# Patient Record
Sex: Male | Born: 1990 | Race: Black or African American | Hispanic: No | Marital: Married | Smoking: Current every day smoker
Health system: Southern US, Community
[De-identification: ages and names within clinical notes are randomized; demographics above are authoritative.]

## PROBLEM LIST (undated history)

## (undated) DIAGNOSIS — T7840XA Allergy, unspecified, initial encounter: Secondary | ICD-10-CM

## (undated) DIAGNOSIS — J45909 Unspecified asthma, uncomplicated: Secondary | ICD-10-CM

## (undated) HISTORY — PX: WRIST FRACTURE SURGERY: SHX121

## (undated) HISTORY — DX: Allergy, unspecified, initial encounter: T78.40XA

---

## 2013-05-01 ENCOUNTER — Other Ambulatory Visit (HOSPITAL_COMMUNITY)
Admission: RE | Admit: 2013-05-01 | Discharge: 2013-05-01 | Disposition: A | Discharge status: Home / Self Care | Payer: PRIVATE HEALTH INSURANCE | Source: Ambulatory Visit | Attending: Family Medicine | Admitting: Family Medicine

## 2013-05-01 ENCOUNTER — Encounter (HOSPITAL_COMMUNITY): Payer: Self-pay | Admitting: Emergency Medicine

## 2013-05-01 ENCOUNTER — Emergency Department (HOSPITAL_COMMUNITY)
Admission: EM | Admit: 2013-05-01 | Discharge: 2013-05-01 | Disposition: A | Discharge status: Home / Self Care | Payer: PRIVATE HEALTH INSURANCE | Source: Home / Self Care | Attending: Family Medicine | Admitting: Family Medicine

## 2013-05-01 DIAGNOSIS — Z202 Contact with and (suspected) exposure to infections with a predominantly sexual mode of transmission: Secondary | ICD-10-CM

## 2013-05-01 DIAGNOSIS — Z113 Encounter for screening for infections with a predominantly sexual mode of transmission: Secondary | ICD-10-CM | POA: Insufficient documentation

## 2013-05-01 MED ORDER — AZITHROMYCIN 250 MG PO TABS
1000.0000 mg | ORAL_TABLET | Freq: Once | ORAL | Status: AC
  Administered 2013-05-01: 1000 mg via ORAL

## 2013-05-01 MED ORDER — AZITHROMYCIN 250 MG PO TABS
ORAL_TABLET | ORAL | Status: AC
  Filled 2013-05-01: qty 4

## 2013-05-01 NOTE — ED Notes (Signed)
Complains of burning during urination x 1 week

## 2013-05-01 NOTE — ED Provider Notes (Signed)
Medical screening examination/treatment/procedure(s) were performed by resident physician or non-physician practitioner and as supervising physician I was immediately available for consultation/collaboration.   Barkley BrunsKINDL,Rhyse Skowron DOUGLAS MD.   Linna HoffJames D Anali Cabanilla, MD 05/01/13 [phone]

## 2013-05-01 NOTE — ED Provider Notes (Signed)
CSN: [id number]     Arrival date & time 05/01/13  1805 History   First MD Initiated Contact with Patient 05/01/13 1836     Chief Complaint  Patient presents with  . Exposure to STD   (Consider location/radiation/quality/duration/timing/severity/associated sxs/prior Treatment) HPI  He is here today for exposure to STD.  He states that his girlfriend was diagnosed and treated for chlamydia this past week.  They have had unprotected sex most recently 2 weeks ago.  He does report one day where he had some penile pain and some discomfort with sex.  Denies any dysuria, rash, pain, discharge today.  No testicular pain or pelvic pain.  He would like STD screening.  He had an HIV test at his health center earlier this week.  No past medical history on file. No past surgical history on file. History reviewed. No pertinent family history. History  Substance Use Topics  . Smoking status: Current Every Day Smoker  . Smokeless tobacco: Not on file  . Alcohol Use: Yes    Review of Systems  Constitutional: Negative for fever.  Genitourinary: Negative for dysuria, discharge, penile swelling, scrotal swelling, genital sores, penile pain and testicular pain.  Skin: Negative for rash.    Allergies  Review of patient's allergies indicates no known allergies.  Home Medications  No current outpatient prescriptions on file. BP 136/81  Pulse 74  Temp(Src) 98.5 F (36.9 C) (Oral)  Resp 14  SpO2 100% Physical Exam  Constitutional: He appears well-developed and well-nourished. No distress.  Genitourinary: Testes normal and penis normal. Right testis shows no mass and no tenderness. Left testis shows no mass and no tenderness. Circumcised. No penile erythema or penile tenderness. No discharge found.  Skin: Skin is warm and dry. No rash noted.  Psychiatric: His behavior is normal. Thought content normal.    ED Course  Procedures (including critical care time) Labs Review Labs Reviewed  RPR  URINE  CYTOLOGY ANCILLARY ONLY   Imaging Review No results found.   MDM   1. Exposure to STD    Given known exposure to chlamydia will treat with azithromycin 1g today. Urine GC/Chlamydia and RPR collected. Discussed importance of avoid sex for the next week. Discussed that he could have gotten chlamydia a week ago or a year ago, we have no way of knowing. Follow up as needed.    Charm RingsErin J Breanna Mcdaniel, MD 05/01/13 [phone]

## 2013-05-01 NOTE — Discharge Instructions (Signed)
We have treated you for chlamydia today.  The results from your tests should be back in 2 days.  Some one will call you with the results.  Please avoid sex for the next week to allow the antibiotic to work.  Follow up as needed.

## 2013-05-02 LAB — RPR: RPR Ser Ql: NONREACTIVE

## 2013-05-03 LAB — URINE CYTOLOGY ANCILLARY ONLY
CHLAMYDIA, DNA PROBE: NEGATIVE
NEISSERIA GONORRHEA: NEGATIVE

## 2013-05-05 ENCOUNTER — Telehealth (HOSPITAL_COMMUNITY): Payer: Self-pay

## 2013-05-05 NOTE — ED Notes (Signed)
Pt. called in for his lab results.  Pt. verified x 2 and told that all were negative. He said his girlfriend was pos. for chlamydia. He has had unprotected sex. I told him not test is 100 % accurate. If he has symptoms of discharge or dysuria, he should get rechecked.  He said he just wants to get rechecked. Desiree LucySuzanne M [facility] 05/05/2013

## 2013-05-26 ENCOUNTER — Emergency Department (HOSPITAL_COMMUNITY)
Admission: EM | Admit: 2013-05-26 | Discharge: 2013-05-26 | Disposition: A | Discharge status: Home / Self Care | Payer: PRIVATE HEALTH INSURANCE | Source: Home / Self Care | Attending: Family Medicine | Admitting: Family Medicine

## 2013-05-26 ENCOUNTER — Encounter (HOSPITAL_COMMUNITY): Payer: Self-pay | Admitting: Emergency Medicine

## 2013-05-26 DIAGNOSIS — J020 Streptococcal pharyngitis: Secondary | ICD-10-CM

## 2013-05-26 LAB — POCT RAPID STREP A: STREPTOCOCCUS, GROUP A SCREEN (DIRECT): POSITIVE — AB

## 2013-05-26 MED ORDER — IBUPROFEN 800 MG PO TABS
800.0000 mg | ORAL_TABLET | Freq: Once | ORAL | Status: AC
  Administered 2013-05-26: 800 mg via ORAL

## 2013-05-26 MED ORDER — PENICILLIN G BENZATHINE 1200000 UNIT/2ML IM SUSP
INTRAMUSCULAR | Status: AC
  Filled 2013-05-26: qty 2

## 2013-05-26 MED ORDER — IBUPROFEN 800 MG PO TABS
ORAL_TABLET | ORAL | Status: AC
  Filled 2013-05-26: qty 1

## 2013-05-26 MED ORDER — PENICILLIN G BENZATHINE 1200000 UNIT/2ML IM SUSP
1.2000 10*6.[IU] | Freq: Once | INTRAMUSCULAR | Status: AC
  Administered 2013-05-26: 1.2 10*6.[IU] via INTRAMUSCULAR

## 2013-05-26 NOTE — ED Provider Notes (Signed)
CSN: [id number]     Arrival date & time 05/26/13  1811 History   First MD Initiated Contact with Patient 05/26/13 1931     Chief Complaint  Patient presents with  . Fever   (Consider location/radiation/quality/duration/timing/severity/associated sxs/prior Treatment) Patient is a 23 y.o. male presenting with pharyngitis. The history is provided by the patient.  Sore Throat This is a new problem. The current episode started yesterday. The problem occurs constantly. The problem has been gradually worsening. Associated symptoms comments: Fever/chills.    History reviewed. No pertinent past medical history. History reviewed. No pertinent past surgical history. History reviewed. No pertinent family history. History  Substance Use Topics  . Smoking status: Current Every Day Smoker  . Smokeless tobacco: Not on file  . Alcohol Use: Yes    Review of Systems  All other systems reviewed and are negative.   Allergies  Review of patient's allergies indicates no known allergies.  Home Medications   Prior to Admission medications   Not on File   BP 137/80  Pulse 89  Temp(Src) 103.2 F (39.6 C) (Oral)  Resp 19  SpO2 100% Physical Exam  Nursing note and vitals reviewed. Constitutional: He is oriented to person, place, and time. He appears well-developed and well-nourished.  HENT:  Head: Normocephalic and atraumatic.  Right Ear: Hearing, tympanic membrane, external ear and ear canal normal.  Left Ear: Hearing, tympanic membrane, external ear and ear canal normal.  Nose: Nose normal.  Mouth/Throat: Uvula is midline and mucous membranes are normal. No oral lesions. No trismus in the jaw. No uvula swelling. Posterior oropharyngeal erythema present. No oropharyngeal exudate, posterior oropharyngeal edema or tonsillar abscesses.  Eyes: Conjunctivae are normal. Right eye exhibits no discharge. Left eye exhibits no discharge. No scleral icterus.  Neck: Normal range of motion. Neck supple. No  thyromegaly present.  Cardiovascular: Normal rate, regular rhythm and normal heart sounds.   Pulmonary/Chest: Effort normal and breath sounds normal. No stridor.  Musculoskeletal: Normal range of motion.  Lymphadenopathy:    He has no cervical adenopathy.  Neurological: He is alert and oriented to person, place, and time.  Skin: Skin is warm and dry. No rash noted.  Psychiatric: He has a normal mood and affect. His behavior is normal.    ED Course  Procedures (including critical care time) Labs Review Labs Reviewed  POCT RAPID STREP A ([facility] ONLY) - Abnormal; Notable for the following:    Streptococcus, Group A Screen (Direct) POSITIVE (*)    All other components within normal limits    Imaging Review No results found.   MDM   1. Strep pharyngitis    Bicillin LA 1.2 MU in [facility]. Tylenol and/or ibuprofen as directed on packaging for home. Follow up prn.    Ardis RowanJennifer Lee Loudon Krakow, PA 05/26/13 2016

## 2013-05-26 NOTE — ED Notes (Signed)
No sign or symptom of allergic response noted at time of D/C, provided a note for work absence tomorrow & return on Friday

## 2013-05-26 NOTE — ED Notes (Signed)
Patient concerned about fever, body aches, cough; history of asthma

## 2013-05-26 NOTE — Discharge Instructions (Signed)
Strep Throat  Strep throat is an infection of the throat caused by a bacteria named Streptococcus pyogenes. Your caregiver may call the infection streptococcal "tonsillitis" or "pharyngitis" depending on whether there are signs of inflammation in the tonsils or back of the throat. Strep throat is most common in children aged 23 15 years during the cold months of the year, but it can occur in people of any age during any season. This infection is spread from person to person (contagious) through coughing, sneezing, or other close contact.  SYMPTOMS   · Fever or chills.  · Painful, swollen, red tonsils or throat.  · Pain or difficulty when swallowing.  · White or yellow spots on the tonsils or throat.  · Swollen, tender lymph nodes or "glands" of the neck or under the jaw.  · Red rash all over the body (rare).  DIAGNOSIS   Many different infections can cause the same symptoms. A test must be done to confirm the diagnosis so the right treatment can be given. A "rapid strep test" can help your caregiver make the diagnosis in a few minutes. If this test is not available, a light swab of the infected area can be used for a throat culture test. If a throat culture test is done, results are usually available in a day or two.  TREATMENT   Strep throat is treated with antibiotic medicine.  HOME CARE INSTRUCTIONS   · Gargle with 1 tsp of salt in 1 cup of warm water, 3 4 times per day or as needed for comfort.  · Family members who also have a sore throat or fever should be tested for strep throat and treated with antibiotics if they have the strep infection.  · Make sure everyone in your household washes their hands well.  · Do not share food, drinking cups, or personal items that could cause the infection to spread to others.  · You may need to eat a soft food diet until your sore throat gets better.  · Drink enough water and fluids to keep your urine clear or pale yellow. This will help prevent dehydration.  · Get plenty of  rest.  · Stay home from school, daycare, or work until you have been on antibiotics for 24 hours.  · Only take over-the-counter or prescription medicines for pain, discomfort, or fever as directed by your caregiver.  · If antibiotics are prescribed, take them as directed. Finish them even if you start to feel better.  SEEK MEDICAL CARE IF:   · The glands in your neck continue to enlarge.  · You develop a rash, cough, or earache.  · You cough up green, yellow-brown, or bloody sputum.  · You have pain or discomfort not controlled by medicines.  · Your problems seem to be getting worse rather than better.  SEEK IMMEDIATE MEDICAL CARE IF:   · You develop any new symptoms such as vomiting, severe headache, stiff or painful neck, chest pain, shortness of breath, or trouble swallowing.  · You develop severe throat pain, drooling, or changes in your voice.  · You develop swelling of the neck, or the skin on the neck becomes red and tender.  · You have a fever.  · You develop signs of dehydration, such as fatigue, dry mouth, and decreased urination.  · You become increasingly sleepy, or you cannot wake up completely.  Document Released: 01/12/2000 Document Revised: 01/01/2012 Document Reviewed: 03/15/2010  ExitCare® Patient Information ©2014 [employer].

## 2013-05-28 ENCOUNTER — Encounter (HOSPITAL_COMMUNITY): Payer: Self-pay | Admitting: Emergency Medicine

## 2013-05-28 ENCOUNTER — Emergency Department (HOSPITAL_COMMUNITY)
Admission: EM | Admit: 2013-05-28 | Discharge: 2013-05-29 | Disposition: A | Discharge status: Home / Self Care | Payer: PRIVATE HEALTH INSURANCE | Attending: Emergency Medicine | Admitting: Emergency Medicine

## 2013-05-28 ENCOUNTER — Emergency Department (HOSPITAL_COMMUNITY): Payer: PRIVATE HEALTH INSURANCE

## 2013-05-28 DIAGNOSIS — R112 Nausea with vomiting, unspecified: Secondary | ICD-10-CM | POA: Insufficient documentation

## 2013-05-28 DIAGNOSIS — R52 Pain, unspecified: Secondary | ICD-10-CM | POA: Insufficient documentation

## 2013-05-28 DIAGNOSIS — J029 Acute pharyngitis, unspecified: Secondary | ICD-10-CM | POA: Insufficient documentation

## 2013-05-28 DIAGNOSIS — Z79899 Other long term (current) drug therapy: Secondary | ICD-10-CM | POA: Insufficient documentation

## 2013-05-28 DIAGNOSIS — J45901 Unspecified asthma with (acute) exacerbation: Secondary | ICD-10-CM | POA: Insufficient documentation

## 2013-05-28 DIAGNOSIS — F17200 Nicotine dependence, unspecified, uncomplicated: Secondary | ICD-10-CM | POA: Insufficient documentation

## 2013-05-28 DIAGNOSIS — J40 Bronchitis, not specified as acute or chronic: Secondary | ICD-10-CM | POA: Insufficient documentation

## 2013-05-28 LAB — CBC WITH DIFFERENTIAL/PLATELET
Basophils Absolute: 0 10*3/uL (ref 0.0–0.1)
Basophils Relative: 0 % (ref 0–1)
Eosinophils Absolute: 0.3 10*3/uL (ref 0.0–0.7)
Eosinophils Relative: 3 % (ref 0–5)
HCT: 39.2 % (ref 39.0–52.0)
Hemoglobin: 13.4 g/dL (ref 13.0–17.0)
LYMPHS ABS: 1.9 10*3/uL (ref 0.7–4.0)
Lymphocytes Relative: 19 % (ref 12–46)
MCH: 30.4 pg (ref 26.0–34.0)
MCHC: 34.2 g/dL (ref 30.0–36.0)
MCV: 88.9 fL (ref 78.0–100.0)
Monocytes Absolute: 2.2 10*3/uL — ABNORMAL HIGH (ref 0.1–1.0)
Monocytes Relative: 21 % — ABNORMAL HIGH (ref 3–12)
NEUTROS PCT: 57 % (ref 43–77)
Neutro Abs: 5.9 10*3/uL (ref 1.7–7.7)
Platelets: 200 10*3/uL (ref 150–400)
RBC: 4.41 MIL/uL (ref 4.22–5.81)
RDW: 13.6 % (ref 11.5–15.5)
WBC: 10.4 10*3/uL (ref 4.0–10.5)

## 2013-05-28 LAB — I-STAT CG4 LACTIC ACID, ED: Lactic Acid, Venous: 0.84 mmol/L (ref 0.5–2.2)

## 2013-05-28 MED ORDER — SODIUM CHLORIDE 0.9 % IV BOLUS (SEPSIS)
1000.0000 mL | Freq: Once | INTRAVENOUS | Status: AC
  Administered 2013-05-28: 1000 mL via INTRAVENOUS

## 2013-05-28 MED ORDER — IPRATROPIUM-ALBUTEROL 0.5-2.5 (3) MG/3ML IN SOLN
3.0000 mL | Freq: Once | RESPIRATORY_TRACT | Status: AC
  Administered 2013-05-29: 3 mL via RESPIRATORY_TRACT
  Filled 2013-05-28: qty 3

## 2013-05-28 MED ORDER — ACETAMINOPHEN 325 MG PO TABS
650.0000 mg | ORAL_TABLET | Freq: Once | ORAL | Status: AC
  Administered 2013-05-28: 650 mg via ORAL
  Filled 2013-05-28: qty 2

## 2013-05-28 NOTE — ED Provider Notes (Signed)
Medical screening examination/treatment/procedure(s) were performed by a resident physician or non-physician practitioner and as the supervising physician I was immediately available for consultation/collaboration.  Sanaiyah Kirchhoff, MD    Ifeanyichukwu Wickham S Anah Billard, MD 05/28/13 0755 

## 2013-05-28 NOTE — ED Provider Notes (Signed)
CSN: [id number]     Arrival date & time 05/28/13  2147 History   First MD Initiated Contact with Patient 05/28/13 2305     Chief Complaint  Patient presents with  . Fever  . Generalized Body Aches     (Consider location/radiation/quality/duration/timing/severity/associated sxs/prior Treatment) HPI 23 yo man with asthma here with 3d of myalgias, chills, fever. Seen at Urgent Care 2d ago for same. Had positive rapid strep. Treated with Bicillin IM but, says he feels worse. Above symptoms continue but, patient has developed interval cough with blood tinged sputum and SOB with exertion.   Patient has been fleeting nauseated but says that he vomited x 1 immediately after he was discharged from UC 2 days ago. PO intake of fluids has been wnl. Appetite mildy diminished.    History reviewed. No pertinent past medical history. History reviewed. No pertinent past surgical history. No family history on file. History  Substance Use Topics  . Smoking status: Current Every Day Smoker  . Smokeless tobacco: Not on file  . Alcohol Use: Yes    Review of Systems Ten point review of symptoms performed and is negative with the exception of symptoms noted above.    Allergies  Review of patient's allergies indicates no known allergies.  Home Medications   Prior to Admission medications   Medication Sig Start Date End Date Taking? Authorizing Provider  acetaminophen (TYLENOL) 500 MG tablet Take 1,000 mg by mouth every 6 (six) hours as needed for mild pain or fever.   Yes Historical Provider, MD  albuterol (PROVENTIL HFA;VENTOLIN HFA) 108 (90 BASE) MCG/ACT inhaler Inhale 2 puffs into the lungs every 6 (six) hours as needed for wheezing or shortness of breath.   Yes Historical Provider, MD  ibuprofen (ADVIL,MOTRIN) 200 MG tablet Take 800 mg by mouth every 6 (six) hours as needed for fever.   Yes Historical Provider, MD   BP 116/59  Pulse 70  Temp(Src) 101.1 F (38.4 C) (Oral)  Resp 18  SpO2  98% Physical Exam Gen: well developed and well nourished appearing Head: NCAT Eyes: PERL, EOMI Nose: no epistaixis or rhinorrhea Mouth/throat: mucosa is elderly dehydrated appearing and pink Neck: supple, no stridor Lungs: CTA B, scattered wheezing bilaterally, RR 20/min, rhonchi or rales CV: regular rate and rythm, good distal pulses.  Abd: soft, notender, nondistended Back: no ttp, no cva ttp Skin: warm and dry Ext: no edema, normal to inspection Neuro: CN ii-xii grossly intact, no focal deficits Psyche; normal affect,  calm and cooperative.  ED Course  Procedures (including critical care time) Labs Review Results for orders placed during the hospital encounter of 05/28/13 (from the past 24 hour(s))  CBC WITH DIFFERENTIAL     Status: Abnormal   Collection Time    05/28/13 11:35 PM      Result Value Ref Range   WBC 10.4  4.0 - 10.5 K/uL   RBC 4.41  4.22 - 5.81 MIL/uL   Hemoglobin 13.4  13.0 - 17.0 g/dL   HCT 40.939.2  81.139.0 - 91.452.0 %   MCV 88.9  78.0 - 100.0 fL   MCH 30.4  26.0 - 34.0 pg   MCHC 34.2  30.0 - 36.0 g/dL   RDW 78.213.6  95.611.5 - 21.315.5 %   Platelets 200  150 - 400 K/uL   Neutrophils Relative % 57  43 - 77 %   Neutro Abs 5.9  1.7 - 7.7 K/uL   Lymphocytes Relative 19  12 - 46 %  Lymphs Abs 1.9  0.7 - 4.0 K/uL   Monocytes Relative 21 (*) 3 - 12 %   Monocytes Absolute 2.2 (*) 0.1 - 1.0 K/uL   Eosinophils Relative 3  0 - 5 %   Eosinophils Absolute 0.3  0.0 - 0.7 K/uL   Basophils Relative 0  0 - 1 %   Basophils Absolute 0.0  0.0 - 0.1 K/uL  I-STAT CG4 LACTIC ACID, ED     Status: None   Collection Time    05/28/13 11:38 PM      Result Value Ref Range   Lactic Acid, Venous 0.84  0.5 - 2.2 mmol/L   DG Chest 2 View (Final result)  Result time: 05/29/13 00:04:10    Final result by Rad Results In Interface (05/29/13 00:04:10)    Narrative:   CLINICAL DATA: Fever, body aches, cough, chest pain, shortness of breath  EXAM: CHEST 2 VIEW  COMPARISON:  None.  FINDINGS: Lungs are clear. No pleural effusion or pneumothorax.  The heart is normal in size.  Visualized osseous structures are within normal limits.  IMPRESSION: No evidence of acute cardiopulmonary disease.   Electronically Signed By: Charline BillsSriyesh Krishnan M.D. On: 05/29/2013 00:04           MDM   Patient is feeling and looking better after tx with IVF and albuterol neb along with prednisone for what appears to be acute bronchitis with excacerbation of asthma. Given patient's persistent fever and recent positive rapid strep test, we will cover with Augmentin for possible early CAP secondary to GAS. Patient is asked to return for red flag sx and is [facility] for [facility] f/[facility].     Brandt LoosenJulie Manly, MD 05/29/13 [phone]

## 2013-05-28 NOTE — ED Notes (Signed)
Pt. Reports fever with generalized body muscle aches and chills onset yesterday , pt. was seen here 2 days ago diagnosed with pharyngitis received IM antibiotic .

## 2013-05-29 LAB — COMPREHENSIVE METABOLIC PANEL
ALBUMIN: 3.3 g/dL — AB (ref 3.5–5.2)
ALK PHOS: 73 U/L (ref 39–117)
ALT: 19 U/L (ref 0–53)
AST: 19 U/L (ref 0–37)
BUN: 10 mg/dL (ref 6–23)
CALCIUM: 9.2 mg/dL (ref 8.4–10.5)
CO2: 24 mEq/L (ref 19–32)
Chloride: 102 mEq/L (ref 96–112)
Creatinine, Ser: 1.17 mg/dL (ref 0.50–1.35)
GFR calc non Af Amer: 87 mL/min — ABNORMAL LOW (ref >90)
GLUCOSE: 100 mg/dL — AB (ref 70–99)
POTASSIUM: 3.9 meq/L (ref 3.7–5.3)
SODIUM: 138 meq/L (ref 137–147)
Total Bilirubin: 0.3 mg/dL (ref 0.3–1.2)
Total Protein: 7.8 g/dL (ref 6.0–8.3)

## 2013-05-29 MED ORDER — AMOXICILLIN-POT CLAVULANATE 875-125 MG PO TABS: 1.0000 | ORAL_TABLET | Freq: Two times a day (BID) | ORAL | Status: DC

## 2013-05-29 MED ORDER — PREDNISONE 20 MG PO TABS: ORAL_TABLET | ORAL | Status: DC

## 2013-05-29 MED ORDER — PREDNISONE 20 MG PO TABS
60.0000 mg | ORAL_TABLET | Freq: Once | ORAL | Status: AC
  Administered 2013-05-29: 60 mg via ORAL
  Filled 2013-05-29: qty 3

## 2013-05-29 NOTE — Discharge Instructions (Signed)
Asthma, Adult  Asthma is a condition of the lungs in which the airways tighten and narrow. Asthma can make it hard to breathe. Asthma cannot be cured, but medicine and lifestyle changes can help control it. Asthma may be started (triggered) by:  · Animal skin flakes (dander).  · Dust.  · Cockroaches.  · Pollen.  · Mold.  · Smoke.  · Cleaning products.  · Hair sprays or aerosol sprays.  · Paint fumes or strong smells.  · Cold air, weather changes, and winds.  · Crying or laughing hard.  · Stress.  · Certain medicines or drugs.  · Foods, such as dried fruit, potato chips, and sparkling grape juice.  · Infections or conditions (colds, flu).  · Exercise.  · Certain medical conditions or diseases.  · Exercise or tiring activities.  HOME CARE   · Take medicine as told by your doctor.  · Use a peak flow meter as told by your doctor. A peak flow meter is a tool that measures how well the lungs are working.  · Record and keep track of the peak flow meter's readings.  · Understand and use the asthma action plan. An asthma action plan is a written plan for taking care of your asthma and treating your attacks.  · To help prevent asthma attacks:  · Do not smoke. Stay away from secondhand smoke.  · Change your heating and air conditioning filter often.  · Limit your use of fireplaces and wood stoves.  · Get rid of pests (such as roaches and mice) and their droppings.  · Throw away plants if you see mold on them.  · Clean your floors. Dust regularly. Use cleaning products that do not smell.  · Have someone vacuum when you are not home. Use a vacuum cleaner with a HEPA filter if possible.  · Replace carpet with wood, tile, or vinyl flooring. Carpet can trap animal skin flakes and dust.  · Use allergy-proof pillows, mattress covers, and box spring covers.  · Wash bed sheets and blankets every week in hot water and dry them in a dryer.  · Use blankets that are made of polyester or cotton.  · Clean bathrooms and kitchens with bleach.  If possible, have someone repaint the walls in these rooms with mold-resistant paint. Keep out of the rooms that are being cleaned and painted.  · Wash hands often.  GET HELP IF:  · You have make a whistling sound when breaking (wheeze), have shortness of breath, or have a cough even if taking medicine to prevent attacks.  · The colored mucus you cough up (sputum) is thicker than usual.  · The colored mucus you cough up changes from clear or white to yellow, green, gray, or bloody.  · You have problems from the medicine you are taking such as:  · A rash.  · Itching.  · Swelling.  · Trouble breathing.  · You need reliever medicines more than 2 3 times a week.  · Your peak flow measurement is still at 50 79% of your personal best after following the action plan for 1 hour.  GET HELP RIGHT AWAY IF:   · You seem to be worse and are not responding to medicine during an asthma attack.  · You are short of breath even at rest.  · You get short of breath when doing very little activity.  · You have trouble eating, drinking, or talking.  · You have chest pain.  ·   You have a fast heartbeat.  · Your lips or fingernails start to turn blue.  · You are lightheaded, dizzy, or faint.  · Your peak flow is less than 50% of your personal best.  · You have a fever or lasting symptoms for more than 2 3 days.  · You have a fever and your symptoms suddenly get worse.  MAKE SURE YOU:   · Understand these instructions.  · Will watch your condition.  · Will get help right away if you are not doing well or get worse.  Document Released: 07/03/2007 Document Revised: 11/04/2012 Document Reviewed: 08/13/2012  ExitCare® Patient Information ©2014 [employer].

## 2013-06-04 LAB — CULTURE, BLOOD (ROUTINE X 2)
CULTURE: NO GROWTH
Culture: NO GROWTH

## 2013-07-12 ENCOUNTER — Emergency Department (INDEPENDENT_AMBULATORY_CARE_PROVIDER_SITE_OTHER)
Admission: EM | Admit: 2013-07-12 | Discharge: 2013-07-12 | Disposition: A | Discharge status: Home / Self Care | Payer: PRIVATE HEALTH INSURANCE | Source: Home / Self Care | Attending: Emergency Medicine | Admitting: Emergency Medicine

## 2013-07-12 ENCOUNTER — Encounter (HOSPITAL_COMMUNITY): Payer: Self-pay | Admitting: Emergency Medicine

## 2013-07-12 ENCOUNTER — Other Ambulatory Visit (HOSPITAL_COMMUNITY)
Admission: RE | Admit: 2013-07-12 | Discharge: 2013-07-12 | Disposition: A | Discharge status: Home / Self Care | Payer: PRIVATE HEALTH INSURANCE | Source: Ambulatory Visit | Attending: Emergency Medicine | Admitting: Emergency Medicine

## 2013-07-12 DIAGNOSIS — R300 Dysuria: Secondary | ICD-10-CM

## 2013-07-12 DIAGNOSIS — Z202 Contact with and (suspected) exposure to infections with a predominantly sexual mode of transmission: Secondary | ICD-10-CM

## 2013-07-12 DIAGNOSIS — Z113 Encounter for screening for infections with a predominantly sexual mode of transmission: Secondary | ICD-10-CM | POA: Insufficient documentation

## 2013-07-12 LAB — RPR

## 2013-07-12 LAB — POCT URINALYSIS DIP (DEVICE)
Bilirubin Urine: NEGATIVE
Glucose, UA: NEGATIVE mg/dL
Hgb urine dipstick: NEGATIVE
Ketones, ur: NEGATIVE mg/dL
Leukocytes, UA: NEGATIVE
Nitrite: NEGATIVE
PH: 7 (ref 5.0–8.0)
PROTEIN: NEGATIVE mg/dL
SPECIFIC GRAVITY, URINE: 1.02 (ref 1.005–1.030)
UROBILINOGEN UA: 0.2 mg/dL (ref 0.0–1.0)

## 2013-07-12 MED ORDER — CEFTRIAXONE SODIUM 250 MG IJ SOLR
250.0000 mg | Freq: Once | INTRAMUSCULAR | Status: AC
  Administered 2013-07-12: 250 mg via INTRAMUSCULAR

## 2013-07-12 MED ORDER — AZITHROMYCIN 250 MG PO TABS
ORAL_TABLET | ORAL | Status: AC
  Filled 2013-07-12: qty 4

## 2013-07-12 MED ORDER — LIDOCAINE HCL (PF) 1 % IJ SOLN
INTRAMUSCULAR | Status: AC
  Filled 2013-07-12: qty 5

## 2013-07-12 MED ORDER — CEFTRIAXONE SODIUM 250 MG IJ SOLR
INTRAMUSCULAR | Status: AC
  Filled 2013-07-12: qty 250

## 2013-07-12 MED ORDER — AZITHROMYCIN 250 MG PO TABS
1000.0000 mg | ORAL_TABLET | Freq: Every day | ORAL | Status: DC
  Administered 2013-07-12: 1000 mg via ORAL

## 2013-07-12 NOTE — ED Notes (Signed)
Patient presents for STD check. Reports he has burning with urination. Denies n/v/d. Patient is alert and oriented and in no acute distress.

## 2013-07-12 NOTE — ED Notes (Signed)
[phone] best number

## 2013-07-12 NOTE — ED Provider Notes (Signed)
CSN: [id number]     Arrival date & time 07/12/13  1216 History   First MD Initiated Contact with Patient 07/12/13 1332     Chief Complaint  Patient presents with  . Exposure to STD   (Consider location/radiation/quality/duration/timing/severity/associated sxs/prior Treatment) HPI Comments: 23 year old male presents requesting an STD check. He has noted 2 episodes of dysuria in the past few days, but has also had normal urination without dysuria. He was having sexual intercourse with someone a couple weeks ago when the condom broke. He denies any testicle pain or penile discharge. Denies any systemic symptoms. He was tested for HIV a couple weeks ago, that was negative. He was not tested for syphilis.  Patient is a 23 y.o. male presenting with STD exposure.  Exposure to STD    History reviewed. No pertinent past medical history. History reviewed. No pertinent past surgical history. No family history on file. History  Substance Use Topics  . Smoking status: Current Every Day Smoker  . Smokeless tobacco: Not on file  . Alcohol Use: Yes    Review of Systems  Genitourinary: Positive for dysuria.  All other systems reviewed and are negative.   Allergies  Review of patient's allergies indicates no known allergies.  Home Medications   Prior to Admission medications   Medication Sig Start Date End Date Taking? Authorizing Provider  acetaminophen (TYLENOL) 500 MG tablet Take 1,000 mg by mouth every 6 (six) hours as needed for mild pain or fever.    Historical Provider, MD  albuterol (PROVENTIL HFA;VENTOLIN HFA) 108 (90 BASE) MCG/ACT inhaler Inhale 2 puffs into the lungs every 6 (six) hours as needed for wheezing or shortness of breath.    Historical Provider, MD  amoxicillin-clavulanate (AUGMENTIN) 875-125 MG per tablet Take 1 tablet by mouth 2 (two) times daily. 05/29/13   Brandt LoosenJulie Manly, MD  ibuprofen (ADVIL,MOTRIN) 200 MG tablet Take 800 mg by mouth every 6 (six) hours as needed for  fever.    Historical Provider, MD  predniSONE (DELTASONE) 20 MG tablet 3 tabs po day one, then 2 tabs daily x 4 days 05/29/13   Brandt LoosenJulie Manly, MD   BP 142/72  Pulse 60  Temp(Src) 98.5 F (36.9 C) (Oral)  Resp 14  SpO2 100% Physical Exam  Nursing note and vitals reviewed. Constitutional: He is oriented to person, place, and time. He appears well-developed and well-nourished. No distress.  HENT:  Head: Normocephalic.  Pulmonary/Chest: Effort normal. No respiratory distress.  Genitourinary: Testes normal and penis normal.  Lymphadenopathy:       Right: No inguinal adenopathy present.       Left: No inguinal adenopathy present.  Neurological: He is alert and oriented to person, place, and time. Coordination normal.  Skin: Skin is warm and dry. No rash noted. He is not diaphoretic.  Psychiatric: He has a normal mood and affect. Judgment normal.    ED Course  Procedures (including critical care time) Labs Review Labs Reviewed  RPR  POCT URINALYSIS DIP (DEVICE)  URINE CYTOLOGY ANCILLARY ONLY    Imaging Review No results found.   MDM   1. Dysuria   2. Possible exposure to STD    Urine cytology and RPR sent. Urinalysis is normal. July to go ahead and be treated, Rocephin and azithromycin given here.  Meds ordered this encounter  Medications  . cefTRIAXone (ROCEPHIN) injection 250 mg    Sig:   . azithromycin (ZITHROMAX) tablet 1,000 mg    Sig:      Earna CoderZachary  Myrtie NeitherH Adah Stoneberg, PA-C 07/12/13 1458

## 2013-07-12 NOTE — ED Notes (Signed)
Patient denies any reaction to antibiotics

## 2013-07-12 NOTE — Discharge Instructions (Signed)

## 2013-07-14 NOTE — ED Provider Notes (Signed)
Medical screening examination/treatment/procedure(s) were performed by non-physician practitioner and as supervising physician I was immediately available for consultation/collaboration.  Leslee Homeavid Bobbyjo Marulanda, M.D.  Reuben Likesavid C Terryon Pineiro, MD 07/14/13 1330

## 2013-09-21 ENCOUNTER — Ambulatory Visit: Payer: PRIVATE HEALTH INSURANCE

## 2013-11-10 ENCOUNTER — Other Ambulatory Visit (HOSPITAL_COMMUNITY)
Admission: RE | Admit: 2013-11-10 | Discharge: 2013-11-10 | Disposition: A | Discharge status: Home / Self Care | Payer: PRIVATE HEALTH INSURANCE | Source: Ambulatory Visit | Attending: Family Medicine | Admitting: Family Medicine

## 2013-11-10 ENCOUNTER — Emergency Department (INDEPENDENT_AMBULATORY_CARE_PROVIDER_SITE_OTHER)
Admission: EM | Admit: 2013-11-10 | Discharge: 2013-11-10 | Disposition: A | Discharge status: Home / Self Care | Payer: PRIVATE HEALTH INSURANCE | Source: Home / Self Care | Attending: Family Medicine | Admitting: Family Medicine

## 2013-11-10 ENCOUNTER — Encounter (HOSPITAL_COMMUNITY): Payer: Self-pay | Admitting: Emergency Medicine

## 2013-11-10 DIAGNOSIS — Z711 Person with feared health complaint in whom no diagnosis is made: Secondary | ICD-10-CM

## 2013-11-10 DIAGNOSIS — Z113 Encounter for screening for infections with a predominantly sexual mode of transmission: Secondary | ICD-10-CM | POA: Diagnosis not present

## 2013-11-10 DIAGNOSIS — Z202 Contact with and (suspected) exposure to infections with a predominantly sexual mode of transmission: Secondary | ICD-10-CM

## 2013-11-10 LAB — POCT URINALYSIS DIP (DEVICE)
BILIRUBIN URINE: NEGATIVE
Glucose, UA: NEGATIVE mg/dL
HGB URINE DIPSTICK: NEGATIVE
Ketones, ur: NEGATIVE mg/dL
Leukocytes, UA: NEGATIVE
Nitrite: NEGATIVE
Protein, ur: NEGATIVE mg/dL
Specific Gravity, Urine: 1.025 (ref 1.005–1.030)
Urobilinogen, UA: 0.2 mg/dL (ref 0.0–1.0)
pH: 6.5 (ref 5.0–8.0)

## 2013-11-10 MED ORDER — CEFTRIAXONE SODIUM 1 G IJ SOLR
250.0000 mg | Freq: Once | INTRAMUSCULAR | Status: AC
  Administered 2013-11-10: 250 mg via INTRAMUSCULAR

## 2013-11-10 MED ORDER — CEFTRIAXONE SODIUM 250 MG IJ SOLR
INTRAMUSCULAR | Status: AC
  Filled 2013-11-10: qty 250

## 2013-11-10 MED ORDER — LIDOCAINE HCL (PF) 1 % IJ SOLN
INTRAMUSCULAR | Status: AC
  Filled 2013-11-10: qty 5

## 2013-11-10 MED ORDER — AZITHROMYCIN 250 MG PO TABS
ORAL_TABLET | ORAL | Status: AC
  Filled 2013-11-10: qty 4

## 2013-11-10 MED ORDER — AZITHROMYCIN 250 MG PO TABS
1000.0000 mg | ORAL_TABLET | Freq: Once | ORAL | Status: AC
Start: 2013-11-10 — End: 2013-11-10
  Administered 2013-11-10: 1000 mg via ORAL

## 2013-11-10 NOTE — Discharge Instructions (Signed)
We will call with positive test results and treat as indicated  °

## 2013-11-10 NOTE — ED Provider Notes (Signed)
CSN: [id number]     Arrival date & time 11/10/13  1622 History   First MD Initiated Contact with Patient 11/10/13 1702     No chief complaint on file.  (Consider location/radiation/quality/duration/timing/severity/associated sxs/prior Treatment) Patient is a 23 y.o. male presenting with STD exposure. The history is provided by the patient.  Exposure to STD This is a new problem. The current episode started more than 1 week ago (2 weeks of dysuria, uses condoms, no sex for 3 wks.). The problem has been gradually worsening. Pertinent negatives include no chest pain and no abdominal pain.    History reviewed. No pertinent past medical history. History reviewed. No pertinent past surgical history. No family history on file. History  Substance Use Topics  . Smoking status: Current Every Day Smoker  . Smokeless tobacco: Not on file  . Alcohol Use: Yes    Review of Systems  Constitutional: Negative.   Cardiovascular: Negative for chest pain.  Gastrointestinal: Negative for abdominal pain.  Genitourinary: Positive for dysuria. Negative for urgency, frequency, hematuria, discharge, genital sores and testicular pain.    Allergies  Review of patient's allergies indicates no known allergies.  Home Medications   Prior to Admission medications   Medication Sig Start Date End Date Taking? Authorizing Provider  acetaminophen (TYLENOL) 500 MG tablet Take 1,000 mg by mouth every 6 (six) hours as needed for mild pain or fever.    Historical Provider, MD  albuterol (PROVENTIL HFA;VENTOLIN HFA) 108 (90 BASE) MCG/ACT inhaler Inhale 2 puffs into the lungs every 6 (six) hours as needed for wheezing or shortness of breath.    Historical Provider, MD  amoxicillin-clavulanate (AUGMENTIN) 875-125 MG per tablet Take 1 tablet by mouth 2 (two) times daily. 05/29/13   Brandt LoosenJulie Manly, MD  ibuprofen (ADVIL,MOTRIN) 200 MG tablet Take 800 mg by mouth every 6 (six) hours as needed for fever.    Historical Provider, MD   predniSONE (DELTASONE) 20 MG tablet 3 tabs po day one, then 2 tabs daily x 4 days 05/29/13   Brandt LoosenJulie Manly, MD   BP 129/88  Pulse 82  Temp(Src) 98.2 F (36.8 C) (Oral)  Resp 16  Ht 5\' 10"  (1.778 m)  Wt 220 lb (99.791 kg)  BMI 31.57 kg/m2  SpO2 96% Physical Exam  Nursing note and vitals reviewed. Constitutional: He is oriented to person, place, and time. He appears well-developed and well-nourished. No distress.  Abdominal: Soft. Bowel sounds are normal. There is no tenderness.  Genitourinary: Testes normal and penis normal.  Musculoskeletal: Normal range of motion.  Lymphadenopathy:       Right: No inguinal adenopathy present.       Left: No inguinal adenopathy present.  Neurological: He is alert and oriented to person, place, and time.  Skin: Skin is warm and dry.    ED Course  Procedures (including critical care time) Labs Review Labs Reviewed  POCT URINALYSIS DIP (DEVICE)  URINE CYTOLOGY ANCILLARY ONLY    Imaging Review No results found.   MDM   1. Concern about STD in male without diagnosis        Linna HoffJames D Kindl, MD 11/10/13 [phone]

## 2013-11-10 NOTE — ED Notes (Signed)
Would like to be screened for STD C/o dysuria x 2 weeks Alert, no signs of acute distress.

## 2013-11-11 LAB — URINE CYTOLOGY ANCILLARY ONLY
CHLAMYDIA, DNA PROBE: NEGATIVE
Neisseria Gonorrhea: NEGATIVE
Trichomonas: NEGATIVE

## 2014-01-30 ENCOUNTER — Encounter (HOSPITAL_COMMUNITY): Payer: Self-pay

## 2014-01-30 ENCOUNTER — Emergency Department (HOSPITAL_COMMUNITY)
Admission: EM | Admit: 2014-01-30 | Discharge: 2014-01-30 | Disposition: A | Discharge status: Home / Self Care | Payer: PRIVATE HEALTH INSURANCE | Source: Home / Self Care | Attending: Family Medicine | Admitting: Family Medicine

## 2014-01-30 DIAGNOSIS — B349 Viral infection, unspecified: Secondary | ICD-10-CM

## 2014-01-30 MED ORDER — IBUPROFEN 800 MG PO TABS
800.0000 mg | ORAL_TABLET | Freq: Once | ORAL | Status: AC
  Administered 2014-01-30: 800 mg via ORAL

## 2014-01-30 MED ORDER — IBUPROFEN 800 MG PO TABS
ORAL_TABLET | ORAL | Status: AC
  Filled 2014-01-30: qty 1

## 2014-01-30 NOTE — ED Notes (Signed)
Reports onset fever, chills, HA  yesterday

## 2014-01-30 NOTE — ED Provider Notes (Signed)
CSN: [id number]     Arrival date & time 01/30/14  0901 History   None    Chief Complaint  Patient presents with  . Chills   (Consider location/radiation/quality/duration/timing/severity/associated sxs/prior Treatment) HPI Comments: 24 year old male who considers himself to be in generally good health is complaining of chills, headache and fever since yesterday at 1800 hrs. He denies upper respiratory congestion, runny nose, PND earache or sore throat. He did not receive a flu shot this year. No GI symptoms.   History reviewed. No pertinent past medical history. History reviewed. No pertinent past surgical history. History reviewed. No pertinent family history. History  Substance Use Topics  . Smoking status: Current Every Day Smoker  . Smokeless tobacco: Not on file  . Alcohol Use: Yes    Review of Systems  Constitutional: Positive for fever, activity change and appetite change.  Respiratory: Positive for cough. Negative for shortness of breath.   Cardiovascular: Negative for chest pain and leg swelling.  Gastrointestinal: Negative.   Genitourinary: Negative.   Musculoskeletal: Negative.   Neurological: Positive for light-headedness and headaches. Negative for tremors, syncope and speech difficulty.    Allergies  Review of patient's allergies indicates no known allergies.  Home Medications   Prior to Admission medications   Medication Sig Start Date End Date Taking? Authorizing Provider  acetaminophen (TYLENOL) 500 MG tablet Take 1,000 mg by mouth every 6 (six) hours as needed for mild pain or fever.    Historical Provider, MD  albuterol (PROVENTIL HFA;VENTOLIN HFA) 108 (90 BASE) MCG/ACT inhaler Inhale 2 puffs into the lungs every 6 (six) hours as needed for wheezing or shortness of breath.    Historical Provider, MD  ibuprofen (ADVIL,MOTRIN) 200 MG tablet Take 800 mg by mouth every 6 (six) hours as needed for fever.    Historical Provider, MD   BP 118/86 mmHg  Pulse 86   Temp(Src) 99.9 F (37.7 C) (Oral)  Resp 12  SpO2 97% Physical Exam  Constitutional: He is oriented to person, place, and time. He appears well-developed and well-nourished. No distress.  Appears mildly ill, laughing, jovial, no distress.  HENT:  Mouth/Throat: Oropharynx is clear and moist. No oropharyngeal exudate.  Bilateral TMs are normal  Eyes: Conjunctivae and EOM are normal.  Neck: Normal range of motion. Neck supple.  Cardiovascular: Normal rate, regular rhythm and normal heart sounds.   Pulmonary/Chest: Effort normal and breath sounds normal. No respiratory distress. He has no wheezes. He has no rales.  Musculoskeletal: Normal range of motion. He exhibits no edema.  Lymphadenopathy:    He has no cervical adenopathy.  Neurological: He is alert and oriented to person, place, and time. He exhibits normal muscle tone.  Skin: Skin is warm and dry. No rash noted.  Nursing note and vitals reviewed.   ED Course  Procedures (including critical care time) Labs Review Labs Reviewed - No data to display  Imaging Review No results found.   MDM   1. Viral syndrome    Ibuprofen 800 mg po now and 600 mg q 6h prn at home Lots of liquids Rest      Hayden Rasmussen, NP 01/30/14 [phone]

## 2014-01-30 NOTE — Discharge Instructions (Signed)
Viral Infections Ibuprofen 600 mg every 6 hours as needed Lots of fluids Rest A viral infection can be caused by different types of viruses.Most viral infections are not serious and resolve on their own. However, some infections may cause severe symptoms and may lead to further complications. SYMPTOMS Viruses can frequently cause:  Minor sore throat.  Aches and pains.  Headaches.  Runny nose.  Different types of rashes.  Watery eyes.  Tiredness.  Cough.  Loss of appetite.  Gastrointestinal infections, resulting in nausea, vomiting, and diarrhea. These symptoms do not respond to antibiotics because the infection is not caused by bacteria. However, you might catch a bacterial infection following the viral infection. This is sometimes called a "superinfection." Symptoms of such a bacterial infection may include:  Worsening sore throat with pus and difficulty swallowing.  Swollen neck glands.  Chills and a high or persistent fever.  Severe headache.  Tenderness over the sinuses.  Persistent overall ill feeling (malaise), muscle aches, and tiredness (fatigue).  Persistent cough.  Yellow, green, or brown mucus production with coughing. HOME CARE INSTRUCTIONS   Only take over-the-counter or prescription medicines for pain, discomfort, diarrhea, or fever as directed by your caregiver.  Drink enough water and fluids to keep your urine clear or pale yellow. Sports drinks can provide valuable electrolytes, sugars, and hydration.  Get plenty of rest and maintain proper nutrition. Soups and broths with crackers or rice are fine. SEEK IMMEDIATE MEDICAL CARE IF:   You have severe headaches, shortness of breath, chest pain, neck pain, or an unusual rash.  You have uncontrolled vomiting, diarrhea, or you are unable to keep down fluids.  You or your child has an oral temperature above 102 F (38.9 C), not controlled by medicine.  Your baby is older than 3 months with a  rectal temperature of 102 F (38.9 C) or higher.  Your baby is 55 months old or younger with a rectal temperature of 100.4 F (38 C) or higher. MAKE SURE YOU:   Understand these instructions.  Will watch your condition.  Will get help right away if you are not doing well or get worse. Document Released: 10/24/2004 Document Revised: 04/08/2011 Document Reviewed: 05/21/2010 [facility] Patient Information 2015 [place], [place]. This information is not intended to replace advice given to you by your health care provider. Make sure you discuss any questions you have with your health care provider.

## 2014-01-31 ENCOUNTER — Ambulatory Visit (INDEPENDENT_AMBULATORY_CARE_PROVIDER_SITE_OTHER): Payer: PRIVATE HEALTH INSURANCE | Admitting: Family Medicine

## 2014-01-31 ENCOUNTER — Ambulatory Visit (INDEPENDENT_AMBULATORY_CARE_PROVIDER_SITE_OTHER): Payer: PRIVATE HEALTH INSURANCE

## 2014-01-31 VITALS — BP 120/70 | HR 84 | Temp 98.9°F | Resp 20 | Ht 70.0 in | Wt 242.4 lb

## 2014-01-31 DIAGNOSIS — R6883 Chills (without fever): Secondary | ICD-10-CM

## 2014-01-31 DIAGNOSIS — R05 Cough: Secondary | ICD-10-CM

## 2014-01-31 DIAGNOSIS — J189 Pneumonia, unspecified organism: Secondary | ICD-10-CM

## 2014-01-31 DIAGNOSIS — R059 Cough, unspecified: Secondary | ICD-10-CM

## 2014-01-31 DIAGNOSIS — J181 Lobar pneumonia, unspecified organism: Secondary | ICD-10-CM

## 2014-01-31 LAB — POCT INFLUENZA A/B
INFLUENZA A, POC: NEGATIVE
Influenza B, POC: NEGATIVE

## 2014-01-31 MED ORDER — BENZONATATE 100 MG PO CAPS: 100.0000 mg | ORAL_CAPSULE | Freq: Three times a day (TID) | ORAL | Status: DC | PRN

## 2014-01-31 MED ORDER — CEFTRIAXONE SODIUM 1 G IJ SOLR
1.0000 g | Freq: Once | INTRAMUSCULAR | Status: AC
  Administered 2014-01-31: 1 g via INTRAMUSCULAR

## 2014-01-31 MED ORDER — HYDROCODONE-HOMATROPINE 5-1.5 MG/5ML PO SYRP: 5.0000 mL | ORAL_SOLUTION | ORAL | Status: DC | PRN

## 2014-01-31 MED ORDER — AZITHROMYCIN 500 MG PO TABS
500.0000 mg | ORAL_TABLET | Freq: Every day | ORAL | Status: DC
Start: 2014-01-31 — End: 2014-06-15

## 2014-01-31 NOTE — Patient Instructions (Addendum)
Take the antibiotic azithromycin 1 pill daily for 5 days  Use the cough syrup 1 teaspoon every 4-6 hours if needed for bad coughing at nighttime  Take the cough pills, Tessalon (benzonatate) 1 or 2 pills 3 times daily if needed for cough  Drink plenty of fluids and get enough rest  Take Tylenol or ibuprofen if needed for further fever or chills  Stay out of work for 2 days    Pneumonia Pneumonia is an infection of the lungs.  CAUSES Pneumonia may be caused by bacteria or a virus. Usually, these infections are caused by breathing infectious particles into the lungs (respiratory tract). SIGNS AND SYMPTOMS   Cough.  Fever.  Chest pain.  Increased rate of breathing.  Wheezing.  Mucus production. DIAGNOSIS  If you have the common symptoms of pneumonia, your health care provider will typically confirm the diagnosis with a chest X-ray. The X-ray will show an abnormality in the lung (pulmonary infiltrate) if you have pneumonia. Other tests of your blood, urine, or sputum may be done to find the specific cause of your pneumonia. Your health care provider may also do tests (blood gases or pulse oximetry) to see how well your lungs are working. TREATMENT  Some forms of pneumonia may be spread to other people when you cough or sneeze. You may be asked to wear a mask before and during your exam. Pneumonia that is caused by bacteria is treated with antibiotic medicine. Pneumonia that is caused by the influenza virus may be treated with an antiviral medicine. Most other viral infections must run their course. These infections will not respond to antibiotics.  HOME CARE INSTRUCTIONS   Cough suppressants may be used if you are losing too much rest. However, coughing protects you by clearing your lungs. You should avoid using cough suppressants if you can.  Your health care provider may have prescribed medicine if he or she thinks your pneumonia is caused by bacteria or influenza. Finish your  medicine even if you start to feel better.  Your health care provider may also prescribe an expectorant. This loosens the mucus to be coughed up.  Take medicines only as directed by your health care provider.  Do not smoke. Smoking is a common cause of bronchitis and can contribute to pneumonia. If you are a smoker and continue to smoke, your cough may last several weeks after your pneumonia has cleared.  A cold steam vaporizer or humidifier in your room or home may help loosen mucus.  Coughing is often worse at night. Sleeping in a semi-upright position in a recliner or using a couple pillows under your head will help with this.  Get rest as you feel it is needed. Your body will usually let you know when you need to rest. PREVENTION A pneumococcal shot (vaccine) is available to prevent a common bacterial cause of pneumonia. This is usually suggested for:  People over 88 years old.  Patients on chemotherapy.  People with chronic lung problems, such as bronchitis or emphysema.  People with immune system problems. If you are over 65 or have a high risk condition, you may receive the pneumococcal vaccine if you have not received it before. In some countries, a routine influenza vaccine is also recommended. This vaccine can help prevent some cases of pneumonia.You may be offered the influenza vaccine as part of your care. If you smoke, it is time to quit. You may receive instructions on how to stop smoking. Your health care provider can  provide medicines and counseling to help you quit. SEEK MEDICAL CARE IF: You have a fever. SEEK IMMEDIATE MEDICAL CARE IF:   Your illness becomes worse. This is especially true if you are elderly or weakened from any other disease.  You cannot control your cough with suppressants and are losing sleep.  You begin coughing up blood.  You develop pain which is getting worse or is uncontrolled with medicines.  Any of the symptoms which initially brought  you in for treatment are getting worse rather than better.  You develop shortness of breath or chest pain. MAKE SURE YOU:   Understand these instructions.  Will watch your condition.  Will get help right away if you are not doing well or get worse. Document Released: 01/14/2005 Document Revised: 05/31/2013 Document Reviewed: 04/05/2010 [facility] Patient Information 2015 [place], [place]. This information is not intended to replace advice given to you by your health care provider. Make sure you discuss any questions you have with your health care provider.

## 2014-01-31 NOTE — Progress Notes (Signed)
Subjective: Patient has undergone a three-day history of a cough. He's had a little nasal congestion. The cough has been nonproductive. Today he has felt like he has had a hard time getting his breath. He's not been wheezing. He works as a Community education officer. Today he is very cold day. He went to the [facility] urgent care yesterday and was told he had a virus. He was concerned he might have pneumonia or something.  Objective: Alert and oriented. Muscular man. His TMs are normal. Throat clear. Neck supple without significant nodes. Chest is clear to auscultation. Heart regular without murmurs. Flu swab taken.  Assessment: Viral syndrome with nonproductive cough  Plan: Chest x-ray Flu swab  [facility] reading (PRIMARY) by  Dr. Alwyn Ren rll pneumonia  Ceftriaxone 1 g  Azithromycin 500 mg daily .  Cough syrup and Tessalon

## 2014-02-24 ENCOUNTER — Encounter (HOSPITAL_COMMUNITY): Payer: Self-pay | Admitting: Emergency Medicine

## 2014-02-24 ENCOUNTER — Emergency Department (INDEPENDENT_AMBULATORY_CARE_PROVIDER_SITE_OTHER)
Admission: EM | Admit: 2014-02-24 | Discharge: 2014-02-24 | Disposition: A | Discharge status: Home / Self Care | Payer: PRIVATE HEALTH INSURANCE | Source: Home / Self Care | Attending: Family Medicine | Admitting: Family Medicine

## 2014-02-24 ENCOUNTER — Other Ambulatory Visit (HOSPITAL_COMMUNITY)
Admission: RE | Admit: 2014-02-24 | Discharge: 2014-02-24 | Disposition: A | Discharge status: Home / Self Care | Payer: PRIVATE HEALTH INSURANCE | Source: Ambulatory Visit | Attending: Family Medicine | Admitting: Family Medicine

## 2014-02-24 DIAGNOSIS — Z711 Person with feared health complaint in whom no diagnosis is made: Secondary | ICD-10-CM

## 2014-02-24 DIAGNOSIS — Z113 Encounter for screening for infections with a predominantly sexual mode of transmission: Secondary | ICD-10-CM | POA: Insufficient documentation

## 2014-02-24 DIAGNOSIS — Z202 Contact with and (suspected) exposure to infections with a predominantly sexual mode of transmission: Secondary | ICD-10-CM

## 2014-02-24 MED ORDER — CEFTRIAXONE SODIUM 250 MG IJ SOLR
250.0000 mg | Freq: Once | INTRAMUSCULAR | Status: AC
  Administered 2014-02-24: 250 mg via INTRAMUSCULAR

## 2014-02-24 MED ORDER — AZITHROMYCIN 250 MG PO TABS
ORAL_TABLET | ORAL | Status: AC
  Filled 2014-02-24: qty 4

## 2014-02-24 MED ORDER — AZITHROMYCIN 250 MG PO TABS
1000.0000 mg | ORAL_TABLET | Freq: Once | ORAL | Status: AC
  Administered 2014-02-24: 1000 mg via ORAL

## 2014-02-24 MED ORDER — CEFTRIAXONE SODIUM 250 MG IJ SOLR
INTRAMUSCULAR | Status: AC
  Filled 2014-02-24: qty 250

## 2014-02-24 MED ORDER — LIDOCAINE HCL (PF) 1 % IJ SOLN
INTRAMUSCULAR | Status: AC
  Filled 2014-02-24: qty 5

## 2014-02-24 NOTE — Discharge Instructions (Signed)
[facility]lam[facility] Advised to practice safe sex Ad[facility] if symptoms persist No sex for 2 weeks [facility] in 2-3 mos.

## 2014-02-24 NOTE — ED Notes (Signed)
Not ready for discharge from department, patient has had injection

## 2014-02-24 NOTE — ED Notes (Signed)
Patient refused to have blood drawn for the RPR & HIV

## 2014-02-24 NOTE — ED Provider Notes (Signed)
CSN: [id number]     Arrival date & time 02/24/14  1239 History   First MD Initiated Contact with Patient 02/24/14 1259     Chief Complaint  Patient presents with  . SEXUALLY TRANSMITTED DISEASE   (Consider location/radiation/quality/duration/timing/severity/associated sxs/prior Treatment) HPI Comments: States he thinks his semen is discolored (yellow) and is concerned that he may have been exposed to STI. Had unprotected sex with new partner 3-4 weeks ago.  Denies penile discharge or dysuria. Reports himself to be otherwise healthy. PCP: none  Patient is a 24 y.o. male presenting with STD exposure. The history is provided by the patient.  Exposure to STD This is a new problem. Episode onset: +unprotected sex 3 weeks ago.    Past Medical History  Diagnosis Date  . Allergy    History reviewed. No pertinent past surgical history. Family History  Problem Relation Age of Onset  . Diabetes Mother    History  Substance Use Topics  . Smoking status: Current Every Day Smoker -- 0.50 packs/day    Types: Cigarettes  . Smokeless tobacco: Never Used  . Alcohol Use: 4.8 oz/week    8 Not specified per week    Review of Systems  All other systems reviewed and are negative.   Allergies  Review of patient's allergies indicates no known allergies.  Home Medications   Prior to Admission medications   Medication Sig Start Date End Date Taking? Authorizing Provider  albuterol (PROVENTIL HFA;VENTOLIN HFA) 108 (90 BASE) MCG/ACT inhaler Inhale 2 puffs into the lungs every 6 (six) hours as needed for wheezing or shortness of breath.    Historical Provider, MD  azithromycin (ZITHROMAX) 500 MG tablet Take 1 tablet (500 mg total) by mouth daily. 01/31/14   Peyton Najjaravid H Hopper, MD  benzonatate (TESSALON) 100 MG capsule Take 1-2 capsules (100-200 mg total) by mouth 3 (three) times daily as needed. 01/31/14   Peyton Najjaravid H Hopper, MD  HYDROcodone-homatropine [facility](HYCODAN) 5-1.5 MG/5ML syrup Take 5 mLs by mouth every 4  (four) hours as needed. 01/31/14   Peyton Najjaravid H Hopper, MD  ibuprofen (ADVIL,MOTRIN) 200 MG tablet Take 800 mg by mouth every 6 (six) hours as needed for fever.    Historical Provider, MD   BP 138/78 mmHg  Pulse 84  Temp(Src) 99.4 F (37.4 C) (Oral)  Resp 22  SpO2 97% Physical Exam  Constitutional: He is oriented to person, place, and time. He appears well-developed and well-nourished. No distress.  HENT:  Head: Normocephalic and atraumatic.  Eyes: Conjunctivae are normal. No scleral icterus.  Cardiovascular: Normal rate, regular rhythm and normal heart sounds.   Pulmonary/Chest: Effort normal and breath sounds normal.  Abdominal: Soft. Bowel sounds are normal. He exhibits no distension. There is no tenderness.  Genitourinary:  Patient declined this portion of exam  Musculoskeletal: Normal range of motion.  Neurological: He is alert and oriented to person, place, and time.  Skin: Skin is warm and dry. No rash noted. No erythema.  Psychiatric: He has a normal mood and affect. His behavior is normal.  Nursing note and vitals reviewed.   ED Course  Procedures (including critical care time) Labs Review Labs Reviewed  RPR  HIV ANTIBODY (ROUTINE TESTING)  URINE CYTOLOGY ANCILLARY ONLY    Imaging Review No results found.   MDM   1. Concern about STD in male without diagnosis     Treated empirically with azithromycin 1000mg  po and ceftriaxone 250mg  [facility] Advised to practice safe sex Advised to follow up  at [facility] if symptoms persist No sex for 2 weeks Follow up at [facility] for repeat HIV testing in 2-3 mos.    Ria Clock, [place] 02/24/14 [phone]

## 2014-02-24 NOTE — ED Notes (Signed)
Reports semen discolored: pale yellow and clear.  Minimal burning with urination

## 2014-02-25 LAB — URINE CYTOLOGY ANCILLARY ONLY
Chlamydia: NEGATIVE
NEISSERIA GONORRHEA: NEGATIVE
Trichomonas: NEGATIVE

## 2014-06-15 ENCOUNTER — Encounter: Payer: Self-pay | Admitting: *Deleted

## 2014-06-15 ENCOUNTER — Emergency Department
Admission: EM | Admit: 2014-06-15 | Discharge: 2014-06-15 | Disposition: A | Discharge status: Home / Self Care | Payer: PRIVATE HEALTH INSURANCE | Source: Home / Self Care | Attending: Family Medicine | Admitting: Family Medicine

## 2014-06-15 DIAGNOSIS — J069 Acute upper respiratory infection, unspecified: Secondary | ICD-10-CM | POA: Diagnosis not present

## 2014-06-15 DIAGNOSIS — B9789 Other viral agents as the cause of diseases classified elsewhere: Principal | ICD-10-CM

## 2014-06-15 DIAGNOSIS — J9801 Acute bronchospasm: Secondary | ICD-10-CM | POA: Diagnosis not present

## 2014-06-15 DIAGNOSIS — R05 Cough: Secondary | ICD-10-CM

## 2014-06-15 DIAGNOSIS — R059 Cough, unspecified: Secondary | ICD-10-CM

## 2014-06-15 MED ORDER — PREDNISONE 20 MG PO TABS: 20.0000 mg | ORAL_TABLET | Freq: Two times a day (BID) | ORAL | Status: DC

## 2014-06-15 MED ORDER — AZITHROMYCIN 250 MG PO TABS: ORAL_TABLET | ORAL | Status: DC

## 2014-06-15 MED ORDER — BENZONATATE 100 MG PO CAPS: ORAL_CAPSULE | ORAL | Status: DC

## 2014-06-15 NOTE — ED Notes (Signed)
Pt c/o productive cough x 4 days. Denies fever. Hx of bronchitis.

## 2014-06-15 NOTE — Discharge Instructions (Signed)
Take plain guaifenesin (1200mg  extended release tabs such as Mucinex) twice daily, with plenty of water, for cough and congestion.  May add Pseudoephedrine (30mg , one or two every 4 to 6 hours) for sinus congestion.  Get adequate rest.   May use Afrin nasal spray (or generic oxymetazoline) twice daily for about 5 days.  Also recommend using saline nasal spray several times daily and saline nasal irrigation (AYR is a common brand).   Use albuterol inhaler as needed. Try warm salt water gargles for sore throat.  Stop all antihistamines for now, and other non-prescription cough/cold preparations. May take Ibuprofen 200mg , 4 tabs every 8 hours with food for chest/sternum discomfort. Begin Azithromycin if not improving about one week or if persistent fever develops  Follow-up with family doctor if not improving about10 days.

## 2014-06-15 NOTE — ED Provider Notes (Signed)
CSN: [id number]     Arrival date & time 06/15/14  0806 History   First MD Initiated Contact with Patient 06/15/14 [phone]     Chief Complaint  Patient presents with  . Cough      HPI Comments: Patient complains of four day history of typical cold-like symptoms including mild sore throat, sinus congestion, headache, fatigue, and cough.  He has a history of exercise asthma and has developed occasional wheezing and shortness of breath with activity.   He has a history of pneumonia January 2016.  He continues to smoke.  The history is provided by the patient.    Past Medical History  Diagnosis Date  . Allergy    Past Surgical History  Procedure Laterality Date  . Wrist fracture surgery Left    Family History  Problem Relation Age of Onset  . Diabetes Mother   . Hypertension Mother    History  Substance Use Topics  . Smoking status: Current Every Day Smoker -- 1.00 packs/day    Types: Cigarettes  . Smokeless tobacco: Never Used  . Alcohol Use: Yes     Comment: 1-2    Review of Systems + sore throat + cough No pleuritic pain, but has tightness in anterior chest + wheezing + nasal congestion + post-nasal drainage No sinus pain/pressure No itchy/red eyes ? earache No hemoptysis + SOB with activity No fever, + chills No nausea No vomiting No abdominal pain No diarrhea No urinary symptoms No skin rash + fatigue + myalgias No headache Used OTC meds without relief  Allergies  Review of patient's allergies indicates no known allergies.  Home Medications   Prior to Admission medications   Medication Sig Start Date End Date Taking? Authorizing Provider  albuterol (PROVENTIL HFA;VENTOLIN HFA) 108 (90 BASE) MCG/ACT inhaler Inhale 2 puffs into the lungs every 6 (six) hours as needed for wheezing or shortness of breath.    Historical Provider, MD  azithromycin (ZITHROMAX Z-PAK) 250 MG tablet Take 2 tabs today; then begin one tab once daily for 4 more days. (Rx void after  06/23/14) 06/15/14   Lattie HawStephen A Renleigh Ouellet, MD  benzonatate (TESSALON) 100 MG capsule Take one tab by mouth HS prn cough 06/15/14   Lattie HawStephen A Maritza Goldsborough, MD  ibuprofen (ADVIL,MOTRIN) 200 MG tablet Take 800 mg by mouth every 6 (six) hours as needed for fever.    Historical Provider, MD  predniSONE (DELTASONE) 20 MG tablet Take 1 tablet (20 mg total) by mouth 2 (two) times daily. Take with food. 06/15/14   Lattie HawStephen A Devonda Pequignot, MD   BP 139/82 mmHg  Pulse 55  Temp(Src) 96.4 F (35.8 C) (Oral)  Resp 16  Ht 5\' 10"  (1.778 m)  Wt 204 lb (92.534 kg)  BMI 29.27 kg/m2  SpO2 99% Physical Exam Nursing notes and Vital Signs reviewed. Appearance:  Patient appears stated age, and in no acute distress Eyes:  Pupils are equal, round, and reactive to light and accomodation.  Extraocular movement is intact.  Conjunctivae are not inflamed  Ears:  Canals normal.  Tympanic membranes normal.  Nose:  Mildly congested turbinates.  No sinus tenderness.    Pharynx:  Normal Neck:  Supple.  Nontender enlarged posterior nodes are palpated bilaterally  Lungs:  Clear to auscultation.  Breath sounds are equal.  Heart:  Regular rate and rhythm without murmurs, rubs, or gallops.  Abdomen:  Nontender without masses or hepatosplenomegaly.  Bowel sounds are present.  No CVA or flank tenderness.  Extremities:  No edema.  No calf tenderness Skin:  No rash present.   ED Course  Procedures  none   MDM   1. Viral URI with cough   2. Bronchospasm   3. Cough    There is no evidence of bacterial infection today.  Begin prednisone burst.  Prescription written for Benzonatate (Tessalon) to take at bedtime for night-time cough.  Take plain guaifenesin (1200mg  extended release tabs such as Mucinex) twice daily, with plenty of water, for cough and congestion.  May add Pseudoephedrine (30mg , one or two every 4 to 6 hours) for sinus congestion.  Get adequate rest.   May use Afrin nasal spray (or generic oxymetazoline) twice daily for about 5 days.   Also recommend using saline nasal spray several times daily and saline nasal irrigation (AYR is a common brand).   Use albuterol inhaler as needed. Try warm salt water gargles for sore throat.  Stop all antihistamines for now, and other non-prescription cough/cold preparations. May take Ibuprofen 200mg , 4 tabs every 8 hours with food for chest/sternum discomfort. Begin Azithromycin if not improving about one week or if persistent fever develops (Given a prescription to hold, with an expiration date)  Follow-up with family doctor if not improving about10 days.      Lattie HawStephen A Nemiah Bubar, MD 06/15/14 [phone]

## 2016-08-29 ENCOUNTER — Ambulatory Visit
Admission: EM | Admit: 2016-08-29 | Discharge: 2016-08-29 | Disposition: A | Discharge status: Home / Self Care | Payer: Self-pay | Attending: Family Medicine | Admitting: Family Medicine

## 2016-08-29 DIAGNOSIS — J029 Acute pharyngitis, unspecified: Secondary | ICD-10-CM

## 2016-08-29 DIAGNOSIS — R05 Cough: Secondary | ICD-10-CM

## 2016-08-29 LAB — RAPID STREP SCREEN (MED CTR MEBANE ONLY): STREPTOCOCCUS, GROUP A SCREEN (DIRECT): NEGATIVE

## 2016-08-29 MED ORDER — AZITHROMYCIN 250 MG PO TABS: 250.0000 mg | ORAL_TABLET | Freq: Every day | ORAL | 0 refills | Status: DC

## 2016-08-29 NOTE — Discharge Instructions (Signed)
Recommend start Zithromax - take 2 tablets together at once today then 1 tablet daily until finished. Recommend take Claritin 10mg  daily. Call [facility] (listed) on Monday if symptoms are not improving.

## 2016-08-29 NOTE — ED Triage Notes (Signed)
26 year old [place] male is here today with complaints of a sore throat, non productive cough and facial pain for the past three weeks. He states he has not taken any OTC medication for relief.

## 2016-08-29 NOTE — ED Provider Notes (Signed)
CSN: [id number]     Arrival date & time 08/29/16  1341 History   First MD Initiated Contact with Patient 08/29/16 1603     Chief Complaint  Patient presents with  . Sore Throat  . Cough   (Consider location/radiation/quality/duration/timing/severity/associated sxs/prior Treatment) 26 year old male presents with irritated throat, nasal congestion, facial pain, post nasal drainage, dry cough and ear pain/pressure for the past 3 weeks. Also experiencing low grade fever and occasional loose stools. Denies any nausea or vomiting. Has not taken any medication for symptoms. Symptoms started around same time as right upper tooth infection-  Was seen at [facility] 3-4 weeks ago and took Amoxicillin. Tooth infection resolved but sore throat persisted. Does smoke and drink alcohol daily. Concerned he may have a more serious condition. No other chronic health issues. Takes no daily medication.    The history is provided by the patient.    Past Medical History:  Diagnosis Date  . Allergy    Past Surgical History:  Procedure Laterality Date  . WRIST FRACTURE SURGERY Left    Family History  Problem Relation Age of Onset  . Diabetes Mother   . Hypertension Mother    Social History  Substance Use Topics  . Smoking status: Current Every Day Smoker    Packs/day: 1.00    Types: Cigarettes  . Smokeless tobacco: Current User  . Alcohol use Yes     Comment: 1-2    Review of Systems  Constitutional: Positive for fatigue and fever. Negative for activity change, appetite change and chills.  HENT: Positive for congestion, ear pain, facial swelling, postnasal drip, rhinorrhea, sinus pain, sinus pressure and sore throat. Negative for ear discharge, mouth sores, nosebleeds, sneezing and voice change.   Eyes: Negative for pain, discharge, redness and itching.  Respiratory: Positive for cough. Negative for chest tightness, shortness of breath and wheezing.   Cardiovascular: Negative for chest pain.   Gastrointestinal: Positive for diarrhea. Negative for abdominal pain, nausea and vomiting.  Musculoskeletal: Negative for arthralgias, back pain, myalgias, neck pain and neck stiffness.  Skin: Negative for color change, rash and wound.  Neurological: Positive for headaches. Negative for dizziness, tremors, seizures, syncope, facial asymmetry, weakness, light-headedness and numbness.  Hematological: Negative for adenopathy. Does not bruise/bleed easily.  Psychiatric/Behavioral: The patient is nervous/anxious.     Allergies  Patient has no known allergies.  Home Medications   Prior to Admission medications   Medication Sig Start Date End Date Taking? Authorizing Provider  ibuprofen (ADVIL,MOTRIN) 200 MG tablet Take 800 mg by mouth every 6 (six) hours as needed for fever.   Yes [provider]  azithromycin (ZITHROMAX) 250 MG tablet Take 1 tablet (250 mg total) by mouth daily. Take first 2 tablets together, then 1 every day until finished. 08/29/16   Sudie GrumblingAmyot, Myndi Wamble Berry, NP   Meds Ordered and Administered this Visit  Medications - No data to display  BP (!) 144/76 (BP Location: Right Arm)   Pulse 88   Temp 99 F (37.2 C) (Oral)   Resp 18   Ht 5\' 9"  (1.753 m)   Wt 203 lb (92.1 kg)   SpO2 100%   BMI 29.98 kg/m  No data found.   Physical Exam  Constitutional: He is oriented to person, place, and time. He appears well-developed and well-nourished. No distress.  HENT:  Head: Normocephalic and atraumatic.  Right Ear: Hearing, tympanic membrane, external ear and ear canal normal.  Left Ear: Hearing, tympanic membrane, external ear and ear  canal normal.  Nose: Rhinorrhea present. Right sinus exhibits frontal sinus tenderness. Right sinus exhibits no maxillary sinus tenderness. Left sinus exhibits frontal sinus tenderness. Left sinus exhibits no maxillary sinus tenderness.  Mouth/Throat: Uvula is midline and mucous membranes are normal. Posterior oropharyngeal erythema present. No  tonsillar abscesses. Tonsils are 2+ on the right. Tonsils are 1+ on the left. No tonsillar exudate.  Eyes: Conjunctivae and EOM are normal. Right eye exhibits no discharge. Left eye exhibits no discharge.  Neck: Normal range of motion. Neck supple.  Cardiovascular: Normal rate, regular rhythm and normal heart sounds.   No murmur heard. Pulmonary/Chest: Effort normal and breath sounds normal. No respiratory distress. He has no decreased breath sounds. He has no wheezes. He has no rhonchi.  Musculoskeletal: Normal range of motion.  Lymphadenopathy:    He has no cervical adenopathy.  Neurological: He is alert and oriented to person, place, and time.  Skin: Skin is warm and dry. No rash noted.  Psychiatric: His speech is normal. Judgment and thought content normal. His mood appears anxious. He is agitated.    Urgent Care Course     Procedures (including critical care time)  Labs Review Labs Reviewed  RAPID STREP SCREEN (NOT AT [facility])  CULTURE, GROUP A STREP [facility])    Imaging Review No results found.   Visual Acuity Review  Right Eye Distance:   Left Eye Distance:   Bilateral Distance:    Right Eye Near:   Left Eye Near:    Bilateral Near:         MDM   1. Sore throat    Reviewed negative rapid strep test results with patient. Discussed that various etiologies could cause his symptoms including environmental allergies, viral illness, bacterial infection or chemical irritants (smoking). Clinical findings do not support dx of thrush. Recommend start OTC Claritin 10mg  daily. May take Ibuprofen 600mg  every 8 hours as needed for pain or fever. Recommend trial Zithromax as directed. Encouraged to d/c smoking.  If symptoms are not improving within 5 days, call [facility] to schedule an appointment for further evaluation.     Sudie GrumblingAmyot, Masaru Chamberlin Berry, NP 08/29/16 2217

## 2016-08-31 LAB — CULTURE, GROUP A STREP (THRC)

## 2017-02-02 ENCOUNTER — Ambulatory Visit
Admission: EM | Admit: 2017-02-02 | Discharge: 2017-02-02 | Disposition: A | Discharge status: Home / Self Care | Payer: BLUE CROSS/BLUE SHIELD | Attending: Family Medicine | Admitting: Family Medicine

## 2017-02-02 ENCOUNTER — Encounter: Payer: Self-pay | Admitting: Gynecology

## 2017-02-02 ENCOUNTER — Other Ambulatory Visit: Payer: Self-pay

## 2017-02-02 DIAGNOSIS — J069 Acute upper respiratory infection, unspecified: Secondary | ICD-10-CM | POA: Diagnosis not present

## 2017-02-02 DIAGNOSIS — R05 Cough: Secondary | ICD-10-CM | POA: Diagnosis not present

## 2017-02-02 HISTORY — DX: Unspecified asthma, uncomplicated: J45.909

## 2017-02-02 MED ORDER — FLUTICASONE PROPIONATE 50 MCG/ACT NA SUSP: 2.0000 | Freq: Every day | NASAL | 0 refills | Status: DC

## 2017-02-02 MED ORDER — HYDROCOD POLST-CPM POLST ER 10-8 MG/5ML PO SUER: 5.0000 mL | Freq: Two times a day (BID) | ORAL | 0 refills | Status: DC

## 2017-02-02 MED ORDER — PSEUDOEPHEDRINE-GUAIFENESIN ER 120-1200 MG PO TB12: 1.0000 | ORAL_TABLET | Freq: Two times a day (BID) | ORAL | 0 refills | Status: DC

## 2017-02-02 MED ORDER — BENZONATATE 200 MG PO CAPS: ORAL_CAPSULE | ORAL | 0 refills | Status: DC

## 2017-02-02 NOTE — ED Triage Notes (Signed)
Per patient c/o sinus infection.

## 2017-02-02 NOTE — ED Provider Notes (Addendum)
[facility]    CSN: [id number] Arrival date & time: 02/02/17  1131     History   Chief Complaint Chief Complaint  Patient presents with  . Sinusitis    HPI Roberto Burns is a 27 y.o. male.   HPI  27 year old male presents with 3-day onset of head pressure nasal stuffiness and a cough that is mildly productive.  No fever or chills.  He does have fatigue.  He states that his fiance was dx with a sinus infection and strep throat last week.  He smokes about half a pack of cigarettes daily.  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 Allergy   . Asthma     There are no active problems to display for this patient.   Past Surgical History:  Procedure Laterality Date  . WRIST FRACTURE SURGERY Left        Home Medications    Prior to Admission medications   Medication Sig Start Date End Date Taking? Authorizing Provider  albuterol (ACCUNEB) 0.63 MG/3ML nebulizer solution Take 1 ampule by nebulization every 6 (six) hours as needed for wheezing.   Yes [provider]  benzonatate (TESSALON) 200 MG capsule Take one cap TID PRN cough 02/02/17   Lutricia Feil, PA-C  chlorpheniramine-HYDROcodone [facility]) 10-8 MG/5ML SUER Take 5 mLs by mouth 2 (two) times daily. 02/02/17   Lutricia Feil, PA-C  fluticasone (FLONASE) 50 MCG/ACT nasal spray Place 2 sprays into both nostrils daily. 02/02/17   Lutricia Feil, PA-C  Pseudoephedrine-Guaifenesin (MUCINEX D MAX STRENGTH) [phone] MG TB12 Take 1 tablet by mouth 2 (two) times daily. 02/02/17   Lutricia Feil, PA-C    Family History Family History  Problem Relation Age of Onset  . Diabetes Mother   . Hypertension Mother     Social History Social History   Tobacco Use  . Smoking status: Current Every Day Smoker    Packs/day: 1.00    Types: Cigarettes  . Smokeless tobacco: Current User  Substance Use Topics  . Alcohol use: Yes   Comment: 1-2  . Drug use: No     Allergies   Patient has no known allergies.   Review of Systems Review of Systems  Constitutional: Positive for activity change and fatigue. Negative for chills and fever.  HENT: Positive for congestion, rhinorrhea and sinus pressure.   Respiratory: Positive for cough and shortness of breath.   All other systems reviewed and are negative.    Physical Exam Triage Vital Signs ED Triage Vitals  Enc Vitals Group     BP 02/02/17 1144 134/84     Pulse Rate 02/02/17 1144 88     Resp 02/02/17 1144 18     Temp 02/02/17 1144 99 F (37.2 C)     Temp Source 02/02/17 1144 Oral     SpO2 02/02/17 1144 98 %     Weight 02/02/17 1142 220 lb (99.8 kg)     Height 02/02/17 1142 5\' 10"  (1.778 m)     Head Circumference --      Peak Flow --      Pain Score 02/02/17 1142 2     Pain Loc --      Pain Edu? --      Excl. in GC? --    No data found.  Updated Vital Signs BP 134/84 (BP Location: Left Arm)   Pulse 88  Temp 99 F (37.2 C) (Oral)   Resp 18   Ht 5\' 10"  (1.778 m)   Wt 220 lb (99.8 kg)   SpO2 98%   BMI 31.57 kg/m   Visual Acuity Right Eye Distance:   Left Eye Distance:   Bilateral Distance:    Right Eye Near:   Left Eye Near:    Bilateral Near:     Physical Exam  Constitutional: He is oriented to person, place, and time. He appears well-developed and well-nourished. No distress.  HENT:  Head: Normocephalic.  Right Ear: External ear normal.  Left Ear: External ear normal.  Nose: Nose normal.  Mouth/Throat: Oropharynx is clear and moist. No oropharyngeal exudate.  Eyes: Pupils are equal, round, and reactive to light. Right eye exhibits no discharge. Left eye exhibits no discharge.  Neck: Normal range of motion.  Pulmonary/Chest: Effort normal and breath sounds normal.  Musculoskeletal: Normal range of motion.  Lymphadenopathy:    He has no cervical adenopathy.  Neurological: He is alert and oriented to person, place, and time.    Skin: Skin is warm and dry. He is not diaphoretic.  Psychiatric: He has a normal mood and affect. His behavior is normal. Judgment and thought content normal.  Nursing note and vitals reviewed.    UC Treatments / Results  Labs (all labs ordered are listed, but only abnormal results are displayed) Labs Reviewed - No data to display  EKG  EKG Interpretation None       Radiology No results found.  Procedures Procedures (including critical care time)  Medications Ordered in UC Medications - No data to display   Initial Impression / Assessment and Plan / UC Course  I have reviewed the triage vital signs and the nursing notes.  Pertinent labs & imaging results that were available during my care of the patient were reviewed by me and considered in my medical decision making (see chart for details).     Plan: 1. Test/x-ray results and diagnosis reviewed with patient 2. rx as per orders; risks, benefits, potential side effects reviewed with patient 3. Recommend supportive treatment with Flonase daily along with Mucinex D for congestion.  Told him this is likely a viral illness and does not require antibiotics.  We will treat his cough with Tussionex and Tessalon Perles for the daytime.  If he is not improving he should follow-up in the clinic or with a primary care physician. 4. F/u prn if symptoms worsen or don't improve   Final Clinical Impressions(s) / UC Diagnoses   Final diagnoses:  Acute upper respiratory infection    ED Discharge Orders        Ordered    fluticasone (FLONASE) 50 MCG/ACT nasal spray  Daily     02/02/17 1252    benzonatate (TESSALON) 200 MG capsule     02/02/17 1252    chlorpheniramine-HYDROcodone (TUSSIONEX PENNKINETIC ER) 10-8 MG/5ML SUER  2 times daily     02/02/17 1252    Pseudoephedrine-Guaifenesin (MUCINEX D MAX STRENGTH) [phone] MG TB12  2 times daily     02/02/17 1252       Controlled Substance Prescriptions [place] Controlled  Substance Registry consulted? Not Applicable   Lutricia FeilRoemer, Yousuf Ager P, PA-C 02/02/17 1401    Lutricia Feiloemer, Zionna Homewood P, PA-C 02/02/17 1402

## 2019-09-17 ENCOUNTER — Emergency Department
Admission: RE | Admit: 2019-09-17 | Discharge: 2019-09-17 | Disposition: A | Discharge status: Home / Self Care | Payer: Self-pay | Source: Ambulatory Visit | Attending: Family Medicine | Admitting: Family Medicine

## 2019-09-17 ENCOUNTER — Other Ambulatory Visit: Payer: Self-pay

## 2019-09-17 VITALS — BP 150/90 | HR 106 | Temp 102.7°F

## 2019-09-17 DIAGNOSIS — J029 Acute pharyngitis, unspecified: Secondary | ICD-10-CM | POA: Diagnosis not present

## 2019-09-17 DIAGNOSIS — J069 Acute upper respiratory infection, unspecified: Secondary | ICD-10-CM

## 2019-09-17 LAB — POC SARS CORONAVIRUS 2 AG -  ED: SARS Coronavirus 2 Ag: NEGATIVE

## 2019-09-17 LAB — POCT RAPID STREP A (OFFICE): Rapid Strep A Screen: NEGATIVE

## 2019-09-17 MED ORDER — PREDNISONE 20 MG PO TABS: ORAL_TABLET | ORAL | 0 refills | Status: DC

## 2019-09-17 MED ORDER — IBUPROFEN 600 MG PO TABS
600.0000 mg | ORAL_TABLET | Freq: Once | ORAL | Status: AC
  Administered 2019-09-17: 600 mg via ORAL

## 2019-09-17 MED ORDER — AZITHROMYCIN 250 MG PO TABS: ORAL_TABLET | ORAL | 0 refills | Status: DC

## 2019-09-17 NOTE — ED Provider Notes (Signed)
[facility]    CSN: [id number] Arrival date & time: 09/17/19  0845      History   Chief Complaint Chief Complaint  Patient presents with  . Appointment    9AM  . Sore Throat  . Chills    HPI Roberto Burns is a 29 y.o. male.   Patient complains of onset of sore throat two days ago.  Yesterday he had his second COVID19 vaccination, and also developed increased sinus congestion, fatigue, and fever.  He denies cough but has developed mild wheezing and tightness in his chest without shortness of breath.  He has asthma, stating that he always gets increased chest congestion during a viral URI that requires use of his albuterol inhaler.  He has used his albuterol inhaler with improvement.  He states that he took several doses of left-over penicillin when his sore throat started, without improvement. Patient reports that he had his first COVID19 vaccination three weeks ago, and developed a concurrent viral URI that lasted about two weeks before resolving.  His wife had similar symptoms at that time. Patient reports that he is travelling to [place] in two days.  The history is provided by the patient.    Past Medical History:  Diagnosis Date  . Allergy   . Asthma     There are no problems to display for this patient.   Past Surgical History:  Procedure Laterality Date  . WRIST FRACTURE SURGERY Left        Home Medications    Prior to Admission medications   Medication Sig Start Date End Date Taking? Authorizing Provider  albuterol (ACCUNEB) 0.63 MG/3ML nebulizer solution Take 1 ampule by nebulization every 6 (six) hours as needed for wheezing.    [provider]  azithromycin (ZITHROMAX Z-PAK) 250 MG tablet Take 2 tabs today; then begin one tab once daily for 4 more days. 09/17/19   Lattie Haw, MD  benzonatate (TESSALON) 200 MG capsule Take one cap TID PRN cough 02/02/17   Lutricia Feil, PA-C  chlorpheniramine-HYDROcodone [facility]) 10-8 MG/5ML SUER Take 5 mLs by mouth 2 (two) times daily. 02/02/17   Lutricia Feil, PA-C  fluticasone (FLONASE) 50 MCG/ACT nasal spray Place 2 sprays into both nostrils daily. 02/02/17   Lutricia Feil, PA-C  predniSONE (DELTASONE) 20 MG tablet Take one tab by mouth twice daily for 4 days, then one daily for 3 days. Take with food. 09/17/19   Lattie Haw, MD  Pseudoephedrine-Guaifenesin (MUCINEX D MAX STRENGTH) [phone] MG TB12 Take 1 tablet by mouth 2 (two) times daily. 02/02/17   Lutricia Feil, PA-C    Family History Family History  Problem Relation Age of Onset  . Diabetes Mother   . Hypertension Mother     Social History Social History   Tobacco Use  . Smoking status: Current Every Day Smoker    Packs/day: 1.00    Types: Cigarettes  . Smokeless tobacco: Current User  Substance Use Topics  . Alcohol use: Yes    Comment: 1-2  . Drug use: No     Allergies   Patient has no known allergies.   Review of Systems Review of Systems + sore throat No cough No pleuritic pain No wheezing No nasal congestion No post-nasal drainage No sinus pain/pressure No itchy/red eyes No earache No hemoptysis No SOB + fever/chills No nausea No vomiting No abdominal pain No diarrhea No urinary symptoms No skin rash + fatigue No myalgias  No headache   Physical Exam Triage Vital Signs ED Triage Vitals  Enc Vitals Group     BP 09/17/19 0857 (!) 150/90     Pulse Rate 09/17/19 0857 (!) 106     Resp --      Temp 09/17/19 0857 (!) 102.7 F (39.3 C)     Temp Source 09/17/19 0857 Oral     SpO2 09/17/19 0857 98 %     Weight --      Height --      Head Circumference --      Peak Flow --      Pain Score 09/17/19 0903 0     Pain Loc --      Pain Edu? --      Excl. in GC? --    No data found.  Updated Vital Signs BP (!) 150/90 (BP Location: Right Arm)   Pulse (!) 106   Temp (!) 102.7 F (39.3 C) (Oral)   SpO2 98%   Visual Acuity Right Eye  Distance:   Left Eye Distance:   Bilateral Distance:    Right Eye Near:   Left Eye Near:    Bilateral Near:     Physical Exam Nursing notes and Vital Signs reviewed. Appearance:  Patient appears stated age, and in no acute distress Eyes:  Pupils are equal, round, and reactive to light and accomodation.  Extraocular movement is intact.  Conjunctivae are not inflamed  Ears:  Canals normal.  Tympanic membranes normal.  Nose:  Mildly congested turbinates.  No sinus tenderness.  Pharynx:  Mildly erythematous, more pronounced on the right Neck:  Supple.  Mildly enlarged lateral and tonsillar nodes, nontender. Lungs:  Scattered faint wheezes.  Breath sounds are equal.  Moving air well. Heart:  Regular rate and rhythm without murmurs, rubs, or gallops.  Abdomen:  Nontender without masses or hepatosplenomegaly.  Bowel sounds are present.  No CVA or flank tenderness.  Extremities:  No edema.  Skin:  No rash present.   UC Treatments / Results  Labs (all labs ordered are listed, but only abnormal results are displayed) Labs Reviewed  SARS-COV-2 RNA,(COVID-19) QUALITATIVE NAAT  STREP A DNA PROBE  POCT RAPID STREP A (OFFICE) negative  POC SARS CORONAVIRUS 2 AG -  ED negative    EKG   Radiology No results found.  Procedures Procedures (including critical care time)  Medications Ordered in UC Medications  ibuprofen (ADVIL) tablet 600 mg (600 mg Oral Given 09/17/19 0941)    Initial Impression / Assessment and Plan / UC Course  I have reviewed the triage vital signs and the nursing notes.  Pertinent labs & imaging results that were available during my care of the patient were reviewed by me and considered in my medical decision making (see chart for details).    Patient appears to have a second viral URI with exacerbation of his asthma.  Because of his history of recurring bronchitis, will begin prednisone burst/taper and empiric Z-pak. COVID19 PCR and Strep A DNA probe pending.    Followup with Family Doctor if not improved in about two weeks.   Final Clinical Impressions(s) / UC Diagnoses   Final diagnoses:  Pharyngitis, unspecified etiology  Viral URI     Discharge Instructions     Take plain guaifenesin (1200mg  extended release tabs such as Mucinex) twice daily, with plenty of water, for cough and congestion.  May add Pseudoephedrine (30mg , one or two every 4 to 6 hours) for sinus congestion.  Get  adequate rest.   May use Afrin nasal spray (or generic oxymetazoline) each morning for about 5 days and then discontinue.  Also recommend using saline nasal spray several times daily and saline nasal irrigation (AYR is a common brand).  Use Flonase nasal spray each morning after using Afrin nasal spray and saline nasal irrigation. Try warm salt water gargles for sore throat.  Stop all antihistamines for now, and other non-prescription cough/cold preparations. May take Ibuprofen 200mg , 3 or 4 tabs every 8 hours with food for fever, body aches, etc. May take Delsym Cough Suppressant ("12 Hour Cough Relief") at bedtime for nighttime cough.  Continue albuterol inhaler as needed.      ED Prescriptions    Medication Sig Dispense Auth. Provider   predniSONE (DELTASONE) 20 MG tablet Take one tab by mouth twice daily for 4 days, then one daily for 3 days. Take with food. 11 tablet , MD   azithromycin (ZITHROMAX Z-PAK) 250 MG tablet Take 2 tabs today; then begin one tab once daily for 4 more days. 6 tablet Lattie Haw, MD        Lattie Haw, MD 09/17/19 [phone]

## 2019-09-17 NOTE — ED Triage Notes (Addendum)
Pt had covid shot 3 weeks ago and started having respiratory sxs. Was still having sxs as well as sore throat before second shot yesterday. After second shot, spiked fever afterwards and still having sore throat. Pt here to be evaluated to see if sxs are from vaccines or other unrelated illness.

## 2019-09-17 NOTE — Discharge Instructions (Addendum)
Take plain guaifenesin (1200mg  extended release tabs such as Mucinex) twice daily, with plenty of water, for cough and congestion.  May add Pseudoephedrine (30mg , one or two every 4 to 6 hours) for sinus congestion.  Get adequate rest.   May use Afrin nasal spray (or generic oxymetazoline) each morning for about 5 days and then discontinue.  Also recommend using saline nasal spray several times daily and saline nasal irrigation (AYR is a common brand).  Use Flonase nasal spray each morning after using Afrin nasal spray and saline nasal irrigation. Try warm salt water gargles for sore throat.  Stop all antihistamines for now, and other non-prescription cough/cold preparations. May take Ibuprofen 200mg , 3 or 4 tabs every 8 hours with food for fever, body aches, etc. May take Delsym Cough Suppressant ("12 Hour Cough Relief") at bedtime for nighttime cough.  Continue albuterol inhaler as needed.

## 2019-09-18 LAB — STREP A DNA PROBE: Group A Strep Probe: NOT DETECTED

## 2019-09-20 LAB — SARS-COV-2 RNA,(COVID-19) QUALITATIVE NAAT: SARS CoV2 RNA: NOT DETECTED

## 2019-09-23 ENCOUNTER — Other Ambulatory Visit: Payer: Self-pay

## 2019-09-23 ENCOUNTER — Ambulatory Visit
Admission: RE | Admit: 2019-09-23 | Discharge: 2019-09-23 | Disposition: A | Discharge status: Home / Self Care | Payer: BLUE CROSS/BLUE SHIELD | Source: Ambulatory Visit | Attending: Emergency Medicine | Admitting: Emergency Medicine

## 2019-09-23 VITALS — BP 118/82 | HR 94 | Temp 100.0°F | Resp 16

## 2019-09-23 DIAGNOSIS — H6691 Otitis media, unspecified, right ear: Secondary | ICD-10-CM

## 2019-09-23 DIAGNOSIS — J029 Acute pharyngitis, unspecified: Secondary | ICD-10-CM | POA: Diagnosis not present

## 2019-09-23 LAB — POCT RAPID STREP A (OFFICE): Rapid Strep A Screen: NEGATIVE

## 2019-09-23 MED ORDER — AMOXICILLIN-POT CLAVULANATE 875-125 MG PO TABS: 1.0000 | ORAL_TABLET | Freq: Two times a day (BID) | ORAL | 0 refills | Status: DC

## 2019-09-23 NOTE — Discharge Instructions (Signed)
Take the Augmentin as directed.  Follow up with your primary care provider if your symptoms are not improving.    

## 2019-09-23 NOTE — ED Triage Notes (Signed)
Patient reports sore throat x1 week, with swollen glands and fever. Reports he was seen in the ED, but was not satisfied with his care there.

## 2019-09-23 NOTE — ED Provider Notes (Signed)
Roberto Burns    CSN: [id number] Arrival date & time: 09/23/19  1647      History   Chief Complaint Chief Complaint  Patient presents with  . Sore Throat  . Fever  . Lymphadenopathy    HPI Roberto Burns is a 29 y.o. male.   Patient presents with sore throat, swollen lymph nodes, fever, right ear pain x1 week.  He denies rash, arthralgias, cough, shortness of breath, vomiting, diarrhea, or other symptoms.  He was seen at [facility] urgent care on 09/17/2019; diagnosed with pharyngitis; negative strep and Covid; treated with Zithromax and prednisone.  He was seen at [facility] ED today; diagnosed with viral pharyngitis; negative strep, negative mono; treated with Norco.  The history is provided by the patient.    Past Medical History:  Diagnosis Date  . Allergy   . Asthma     There are no problems to display for this patient.   Past Surgical History:  Procedure Laterality Date  . WRIST FRACTURE SURGERY Left        Home Medications    Prior to Admission medications   Medication Sig Start Date End Date Taking? Authorizing Provider  albuterol (ACCUNEB) 0.63 MG/3ML nebulizer solution Take 1 ampule by nebulization every 6 (six) hours as needed for wheezing.    [provider]  amoxicillin-clavulanate (AUGMENTIN) 875-125 MG tablet Take 1 tablet by mouth every 12 (twelve) hours. 09/23/19   Mickie Bail, NP  azithromycin (ZITHROMAX Z-PAK) 250 MG tablet Take 2 tabs today; then begin one tab once daily for 4 more days. 09/17/19   Lattie Haw, MD  benzonatate (TESSALON) 200 MG capsule Take one cap TID PRN cough 02/02/17   Lutricia Feil, PA-C  chlorpheniramine-HYDROcodone [facility]) 10-8 MG/5ML SUER Take 5 mLs by mouth 2 (two) times daily. 02/02/17   Lutricia Feil, PA-C  fluticasone (FLONASE) 50 MCG/ACT nasal spray Place 2 sprays into both nostrils daily. 02/02/17   Lutricia Feil, PA-C  predniSONE (DELTASONE) 20 MG tablet Take one tab  by mouth twice daily for 4 days, then one daily for 3 days. Take with food. 09/17/19   Lattie Haw, MD  Pseudoephedrine-Guaifenesin (MUCINEX D MAX STRENGTH) [phone] MG TB12 Take 1 tablet by mouth 2 (two) times daily. 02/02/17   Lutricia Feil, PA-C    Family History Family History  Problem Relation Age of Onset  . Diabetes Mother   . Hypertension Mother     Social History Social History   Tobacco Use  . Smoking status: Current Every Day Smoker    Packs/day: 0.50    Types: Cigarettes  . Smokeless tobacco: Current User  Substance Use Topics  . Alcohol use: Yes    Comment: 1-2  . Drug use: No     Allergies   Patient has no known allergies.   Review of Systems Review of Systems  Constitutional: Positive for fever. Negative for chills.  HENT: Positive for ear pain and sore throat. Negative for trouble swallowing.   Eyes: Negative for pain and visual disturbance.  Respiratory: Negative for cough and shortness of breath.   Cardiovascular: Negative for chest pain and palpitations.  Gastrointestinal: Negative for abdominal pain and vomiting.  Genitourinary: Negative for dysuria and hematuria.  Musculoskeletal: Negative for arthralgias and back pain.  Skin: Negative for color change and rash.  Neurological: Negative for seizures and syncope.  All other systems reviewed and are negative.    Physical Exam Triage Vital Signs ED  Triage Vitals  Enc Vitals Group     BP      Pulse      Resp      Temp      Temp src      SpO2      Weight      Height      Head Circumference      Peak Flow      Pain Score      Pain Loc      Pain Edu?      Excl. in GC?    No data found.  Updated Vital Signs BP 118/82   Pulse 94   Temp 100 F (37.8 C)   Resp 16   SpO2 97%   Visual Acuity Right Eye Distance:   Left Eye Distance:   Bilateral Distance:    Right Eye Near:   Left Eye Near:    Bilateral Near:     Physical Exam Vitals and nursing note reviewed.    Constitutional:      General: He is not in acute distress.    Appearance: He is well-developed. He is not ill-appearing.  HENT:     Head: Normocephalic and atraumatic.     Right Ear: Ear canal normal. Tympanic membrane is erythematous.     Left Ear: Tympanic membrane and ear canal normal.     Nose: Nose normal.     Mouth/Throat:     Mouth: Mucous membranes are moist.     Pharynx: Uvula midline. Oropharyngeal exudate and posterior oropharyngeal erythema present. No uvula swelling.     Tonsils: 2+ on the right. 1+ on the left.  Eyes:     Conjunctiva/sclera: Conjunctivae normal.  Cardiovascular:     Rate and Rhythm: Normal rate and regular rhythm.     Heart sounds: No murmur heard.   Pulmonary:     Effort: Pulmonary effort is normal. No respiratory distress.     Breath sounds: Normal breath sounds.  Abdominal:     Palpations: Abdomen is soft.     Tenderness: There is no abdominal tenderness. There is no guarding or rebound.  Musculoskeletal:     Cervical back: Neck supple.  Skin:    General: Skin is warm and dry.     Findings: No rash.  Neurological:     General: No focal deficit present.     Mental Status: He is alert and oriented to person, place, and time.     Gait: Gait normal.  Psychiatric:        Mood and Affect: Mood normal.        Behavior: Behavior normal.      UC Treatments / Results  Labs (all labs ordered are listed, but only abnormal results are displayed) Labs Reviewed  POCT RAPID STREP A (OFFICE)    EKG   Radiology No results found.  Procedures Procedures (including critical care time)  Medications Ordered in UC Medications - No data to display  Initial Impression / Assessment and Plan / UC Course  I have reviewed the triage vital signs and the nursing notes.  Pertinent labs & imaging results that were available during my care of the patient were reviewed by me and considered in my medical decision making (see chart for details).   Right  otitis media, acute pharyngitis.  Treating with Augmentin.  Throat culture pending.  Instructed patient to follow-up with his PCP or an ENT if his symptoms or not improving.  Patient agrees to plan  of care.   Final Clinical Impressions(s) / UC Diagnoses   Final diagnoses:  Right otitis media, unspecified otitis media type  Acute pharyngitis, unspecified etiology     Discharge Instructions     Take the Augmentin as directed.    Follow up with your primary care provider if your symptoms are not improving.        ED Prescriptions    Medication Sig Dispense Auth. Provider   amoxicillin-clavulanate (AUGMENTIN) 875-125 MG tablet Take 1 tablet by mouth every 12 (twelve) hours. 14 tablet Mickie Bail, NP     PDMP not reviewed this encounter.   Mickie Bail, NP 09/23/19 1728

## 2019-12-19 ENCOUNTER — Other Ambulatory Visit: Payer: Self-pay

## 2019-12-19 ENCOUNTER — Ambulatory Visit
Admission: EM | Admit: 2019-12-19 | Discharge: 2019-12-19 | Disposition: A | Discharge status: Home / Self Care | Payer: BLUE CROSS/BLUE SHIELD | Attending: Family Medicine | Admitting: Family Medicine

## 2019-12-19 ENCOUNTER — Encounter: Payer: Self-pay | Admitting: Emergency Medicine

## 2019-12-19 DIAGNOSIS — L0291 Cutaneous abscess, unspecified: Secondary | ICD-10-CM | POA: Diagnosis not present

## 2019-12-19 MED ORDER — DOXYCYCLINE HYCLATE 100 MG PO CAPS: 100.0000 mg | ORAL_CAPSULE | Freq: Two times a day (BID) | ORAL | 0 refills | Status: AC

## 2019-12-19 NOTE — ED Triage Notes (Signed)
Patient states that he might have an abscess or boil in his right groin that started 2 days ago.  Patient states that it is tender to the touch.

## 2019-12-19 NOTE — ED Notes (Signed)
Applied non adherent and medi pore tape over patient's incision. Instructed patient on when to change bandage and to be careful not pull out packing.

## 2019-12-19 NOTE — Discharge Instructions (Signed)
Remove packing in 48 hours.  If comes out before then that's okay.  Antibiotic as prescribed.  Take care  Dr. Adriana Simas

## 2019-12-19 NOTE — ED Provider Notes (Signed)
[facility]    CSN: [id number] Arrival date & time: 12/19/19  0846  History   Chief Complaint Chief Complaint  Patient presents with  . Abscess    right groin   HPI   29 year old male presents with the above complaints  Started 2 days ago.  He states that he has a "boil" in his right groin.  Area is tender to the touch.  No relieving factors.  No fever.  No current drainage.  He has had this previously.  No other associated symptoms.  No other complaints.  Past Medical History:  Diagnosis Date  . Allergy   . Asthma    Past Surgical History:  Procedure Laterality Date  . WRIST FRACTURE SURGERY Left    Home Medications    Prior to Admission medications   Medication Sig Start Date End Date Taking? Authorizing Provider  doxycycline (VIBRAMYCIN) 100 MG capsule Take 1 capsule (100 mg total) by mouth 2 (two) times daily. 12/19/19   [name], DO  fluticasone (FLONASE) 50 MCG/ACT nasal spray Place 2 sprays into both nostrils daily. 02/02/17 12/19/19  Lutricia Feil, PA-C    Family History Family History  Problem Relation Age of Onset  . Diabetes Mother   . Hypertension Mother     Social History Social History   Tobacco Use  . Smoking status: Current Every Day Smoker    Packs/day: 0.50    Types: Cigarettes  . Smokeless tobacco: Never Used  Vaping Use  . Vaping Use: Every day  Substance Use Topics  . Alcohol use: Yes    Comment: 1-2  . Drug use: No     Allergies   Patient has no known allergies.   Review of Systems Review of Systems  Skin:       Abscess.   Physical Exam Triage Vital Signs ED Triage Vitals  Enc Vitals Group     BP 12/19/19 0859 (!) 142/86     Pulse Rate 12/19/19 0859 92     Resp 12/19/19 0859 16     Temp 12/19/19 0859 98.9 F (37.2 C)     Temp Source 12/19/19 0859 Oral     SpO2 12/19/19 0859 100 %     Weight 12/19/19 0857 215 lb (97.5 kg)     Height 12/19/19 0857 5\' 9"  (1.753 m)     Head Circumference --       Peak Flow --      Pain Score 12/19/19 0857 7     Pain Loc --      Pain Edu? --      Excl. in GC? --    Updated Vital Signs BP (!) 142/86 (BP Location: Right Arm)   Pulse 92   Temp 98.9 F (37.2 C) (Oral)   Resp 16   Ht 5\' 9"  (1.753 m)   Wt 97.5 kg   SpO2 100%   BMI 31.75 kg/m   Visual Acuity Right Eye Distance:   Left Eye Distance:   Bilateral Distance:    Right Eye Near:   Left Eye Near:    Bilateral Near:     Physical Exam Vitals and nursing note reviewed.  Constitutional:      General: He is not in acute distress.    Appearance: Normal appearance. He is not ill-appearing.  Eyes:     General:        Right eye: No discharge.        Left eye: No discharge.  Conjunctiva/sclera: Conjunctivae normal.  Pulmonary:     Effort: Pulmonary effort is normal. No respiratory distress.  Skin:    Comments: Right inguinal region with an area of erythema and induration.  There is a small area of fluctuance.  Neurological:     Mental Status: He is alert.  Psychiatric:        Mood and Affect: Mood normal.        Behavior: Behavior normal.    UC Treatments / Results  Labs (all labs ordered are listed, but only abnormal results are displayed) Labs Reviewed - No data to display  EKG   Radiology No results found.  Procedures Incision and Drainage  Date/Time: 12/19/2019 9:57 AM Performed by: [name], DO Authorized by: [name], DO   Consent:    Consent obtained:  Verbal   Consent given by:  Patient Location:    Type:  Abscess   Location: Right groin. Pre-procedure details:    Skin preparation:  Betadine Anesthesia (see MAR for exact dosages):    Anesthesia method:  Local infiltration   Local anesthetic:  Lidocaine 1% WITH epi Procedure type:    Complexity:  Simple Procedure details:    Incision types:  Stab incision   Scalpel blade:  11   Drainage:  Bloody (Minimal pus)   Packing materials:  1/4 in gauze Post-procedure details:    Patient  tolerance of procedure:  Tolerated well, no immediate complications   (including critical care time)  Medications Ordered in UC Medications - No data to display  Initial Impression / Assessment and Plan / UC Course  I have reviewed the triage vital signs and the nursing notes.  Pertinent labs & imaging results that were available during my care of the patient were reviewed by me and considered in my medical decision making (see chart for details).    29 year old male presents with abscess.  Incision and drainage performed today.  See above.  Placed on doxycycline.  Remove packing in 48 hours.  Final Clinical Impressions(s) / UC Diagnoses   Final diagnoses:  Abscess     Discharge Instructions     Remove packing in 48 hours.  If comes out before then that's okay.  Antibiotic as prescribed.  Take care  Dr. Adriana Simas    ED Prescriptions    Medication Sig Dispense Auth. Provider   doxycycline (VIBRAMYCIN) 100 MG capsule Take 1 capsule (100 mg total) by mouth 2 (two) times daily. 14 capsule Everlene Other G, DO     PDMP not reviewed this encounter.   [name], [place] 12/19/19 [id number]

## 2019-12-21 ENCOUNTER — Emergency Department
Admission: EM | Admit: 2019-12-21 | Discharge: 2019-12-21 | Disposition: A | Discharge status: Home / Self Care | Payer: BLUE CROSS/BLUE SHIELD | Attending: Emergency Medicine | Admitting: Emergency Medicine

## 2019-12-21 ENCOUNTER — Encounter: Payer: Self-pay | Admitting: Emergency Medicine

## 2019-12-21 ENCOUNTER — Other Ambulatory Visit: Payer: Self-pay

## 2019-12-21 ENCOUNTER — Emergency Department: Payer: BLUE CROSS/BLUE SHIELD

## 2019-12-21 ENCOUNTER — Ambulatory Visit
Admission: EM | Admit: 2019-12-21 | Discharge: 2019-12-21 | Disposition: A | Discharge status: Other Acute Inpatient Hospital | Payer: BLUE CROSS/BLUE SHIELD | Attending: Family Medicine | Admitting: Family Medicine

## 2019-12-21 DIAGNOSIS — L0291 Cutaneous abscess, unspecified: Secondary | ICD-10-CM | POA: Diagnosis present

## 2019-12-21 DIAGNOSIS — L02214 Cutaneous abscess of groin: Secondary | ICD-10-CM | POA: Insufficient documentation

## 2019-12-21 DIAGNOSIS — B9689 Other specified bacterial agents as the cause of diseases classified elsewhere: Secondary | ICD-10-CM | POA: Insufficient documentation

## 2019-12-21 DIAGNOSIS — L03818 Cellulitis of other sites: Secondary | ICD-10-CM

## 2019-12-21 DIAGNOSIS — K4090 Unilateral inguinal hernia, without obstruction or gangrene, not specified as recurrent: Secondary | ICD-10-CM

## 2019-12-21 LAB — COMPREHENSIVE METABOLIC PANEL
ALT: 24 U/L (ref 0–44)
AST: 21 U/L (ref 15–41)
Albumin: 4.1 g/dL (ref 3.5–5.0)
Alkaline Phosphatase: 55 U/L (ref 38–126)
Anion gap: 10 (ref 5–15)
BUN: 11 mg/dL (ref 6–20)
CO2: 26 mmol/L (ref 22–32)
Calcium: 9.4 mg/dL (ref 8.9–10.3)
Chloride: 102 mmol/L (ref 98–111)
Creatinine, Ser: 1.13 mg/dL (ref 0.61–1.24)
GFR, Estimated: >60 mL/min (ref >60)
Glucose, Bld: 104 mg/dL — ABNORMAL HIGH (ref 70–99)
Potassium: 3.8 mmol/L (ref 3.5–5.1)
Sodium: 138 mmol/L (ref 135–145)
Total Bilirubin: 0.9 mg/dL (ref 0.3–1.2)
Total Protein: 8.4 g/dL — ABNORMAL HIGH (ref 6.5–8.1)

## 2019-12-21 LAB — CBC
HCT: 39.1 % (ref 39.0–52.0)
Hemoglobin: 13.3 g/dL (ref 13.0–17.0)
MCH: 31.5 pg (ref 26.0–34.0)
MCHC: 34 g/dL (ref 30.0–36.0)
MCV: 92.7 fL (ref 80.0–100.0)
Platelets: 213 10*3/uL (ref 150–400)
RBC: 4.22 MIL/uL (ref 4.22–5.81)
RDW: 13.6 % (ref 11.5–15.5)
WBC: 13.4 10*3/uL — ABNORMAL HIGH (ref 4.0–10.5)
nRBC: 0 % (ref 0.0–0.2)

## 2019-12-21 MED ORDER — NAPROXEN 500 MG PO TABS: 500.0000 mg | ORAL_TABLET | Freq: Two times a day (BID) | ORAL | Status: AC

## 2019-12-21 NOTE — ED Triage Notes (Signed)
Abscess to right groin, seen Sunday and was lanced, on antibiotics, seen at urgent care this am and sent to ER for potential sx

## 2019-12-21 NOTE — Discharge Instructions (Addendum)
Follow discharge care instructions continue previous medications.  Apply warm compress to the area at least twice a day for minimum of 10 minutes per session.

## 2019-12-21 NOTE — ED Provider Notes (Signed)
[facility]    CSN: [id number] Arrival date & time: 12/21/19  0807      History   Chief Complaint Chief Complaint  Patient presents with  . Abscess   HPI  29 year old male presents for follow-up regarding abscess.  Incision and drainage performed on 11/21.  Patient reports that he seems to be worsening.  He reports fever at home, T-max 99.8.  He reports that he is experiencing chills.  The area seems more swollen.  Patient is concerned given the worsening.  No other complaints this time.  Past Medical History:  Diagnosis Date  . Allergy   . Asthma    Past Surgical History:  Procedure Laterality Date  . WRIST FRACTURE SURGERY Left    Home Medications    Prior to Admission medications   Medication Sig Start Date End Date Taking? Authorizing Provider  doxycycline (VIBRAMYCIN) 100 MG capsule Take 1 capsule (100 mg total) by mouth 2 (two) times daily. 12/19/19  Yes Rendi Mapel G, DO  fluticasone (FLONASE) 50 MCG/ACT nasal spray Place 2 sprays into both nostrils daily. 02/02/17 12/19/19  Lutricia Feil, PA-C    Family History Family History  Problem Relation Age of Onset  . Diabetes Mother   . Hypertension Mother     Social History Social History   Tobacco Use  . Smoking status: Current Every Day Smoker    Packs/day: 0.50    Types: Cigarettes  . Smokeless tobacco: Never Used  Vaping Use  . Vaping Use: Every day  Substance Use Topics  . Alcohol use: Yes    Comment: 1-2  . Drug use: No     Allergies   Patient has no known allergies.   Review of Systems Review of Systems  Skin:       Right inguinal swelling, redness    Physical Exam Triage Vital Signs ED Triage Vitals  Enc Vitals Group     BP 12/21/19 0827 (!) 143/78     Pulse Rate 12/21/19 0827 83     Resp 12/21/19 0827 18     Temp 12/21/19 0827 98.8 F (37.1 C)     Temp Source 12/21/19 0827 Oral     SpO2 12/21/19 0827 100 %     Weight 12/21/19 0825 215 lb (97.5 kg)     Height  12/21/19 0825 5\' 9"  (1.753 m)     Head Circumference --      Peak Flow --      Pain Score 12/21/19 0825 4     Pain Loc --      Pain Edu? --      Excl. in GC? --    Updated Vital Signs BP (!) 143/78 (BP Location: Right Arm)   Pulse 83   Temp 98.8 F (37.1 C) (Oral)   Resp 18   Ht 5\' 9"  (1.753 m)   Wt 97.5 kg   SpO2 100%   BMI 31.75 kg/m   Visual Acuity Right Eye Distance:   Left Eye Distance:   Bilateral Distance:    Right Eye Near:   Left Eye Near:    Bilateral Near:     Physical Exam Vitals and nursing note reviewed.  Constitutional:      General: He is not in acute distress.    Appearance: Normal appearance. He is not ill-appearing.  HENT:     Head: Normocephalic and atraumatic.  Pulmonary:     Effort: Pulmonary effort is normal. No respiratory distress.  Genitourinary:  Comments: Swelling, erythema and tenderness to palpation at the labelled location. Packing removed today. Neurological:     Mental Status: He is alert.  Psychiatric:        Mood and Affect: Mood normal.        Behavior: Behavior normal.      UC Treatments / Results  Labs (all labs ordered are listed, but only abnormal results are displayed) Labs Reviewed - No data to display  EKG   Radiology No results found.  Procedures Procedures (including critical care time)  Medications Ordered in UC Medications - No data to display  Initial Impression / Assessment and Plan / UC Course  I have reviewed the triage vital signs and the nursing notes.  Pertinent labs & imaging results that were available during my care of the patient were reviewed by me and considered in my medical decision making (see chart for details).    29 year old male presents with worsening regarding recent abscess.  There is extensive induration.  There is no appreciable fluctuance at this time.  This appears to be worsening cellulitis at this time.  Discussed case with general surgery Dr. Tonna Boehringer.  He  recommended that I send him to the ER for imaging and antibiotics.  Patient is going to [facility] for further evaluation and management.  Final Clinical Impressions(s) / UC Diagnoses   Final diagnoses:  Cellulitis of other specified site     Discharge Instructions     Go to the ER (per General Surgery).  You will likely need imaging and intravenous antibiotics.  Take care  Dr. Adriana Simas     ED Prescriptions    None     PDMP not reviewed this encounter.   [name], DO 12/21/19 0900

## 2019-12-21 NOTE — ED Provider Notes (Signed)
[facility] Emergency Department Provider Note   ____________________________________________   First MD Initiated Contact with Patient 12/21/19 1058     (approximate)  I have reviewed the triage vital signs and the nursing notes.   HISTORY  Chief Complaint Abscess    HPI Roberto Burns is a 29 y.o. male patient presents with pain edema to the right groin area.  Patient was seen at urgent care clinic 2 days ago and the lesion was lanced.  Patient placed on antibiotics.  Patient had a follow-up visit to urgent care today when he removed the packing and state that is still continues to be edematous.  Patient rates his pain as a 6/10.  Patient describes pain as "achy".  Patient sent to the ED for further evaluation.         History reviewed. No pertinent past medical history.  There are no problems to display for this patient.     Prior to Admission medications   Medication Sig Start Date End Date Taking? Authorizing Provider  naproxen (NAPROSYN) 500 MG tablet Take 1 tablet (500 mg total) by mouth 2 (two) times daily with a meal. 12/21/19   [name], PA-C    Allergies Patient has no known allergies.  History reviewed. No pertinent family history.  Social History Social History   Tobacco Use  . Smoking status: Not on file  Substance Use Topics  . Alcohol use: Not on file  . Drug use: Not on file    Review of Systems Constitutional: No fever/chills Eyes: No visual changes. ENT: No sore throat. Cardiovascular: Denies chest pain. Respiratory: Denies shortness of breath. Gastrointestinal: No abdominal pain.  No nausea, no vomiting.  No diarrhea.  No constipation. Genitourinary: Negative for dysuria. Musculoskeletal: Negative for back pain. Skin: Negative for rash.  Right inguinal nodule. Neurological: Negative for headaches, focal weakness or numbness.   ____________________________________________   PHYSICAL  EXAM:  VITAL SIGNS: ED Triage Vitals  Enc Vitals Group     BP 12/21/19 0925 138/82     Pulse Rate 12/21/19 0925 90     Resp 12/21/19 0925 18     Temp 12/21/19 0925 99.4 F (37.4 C)     Temp Source 12/21/19 0925 Oral     SpO2 12/21/19 0925 100 %     Weight 12/21/19 0926 215 lb (97.5 kg)     Height 12/21/19 0926 5\' 9"  (1.753 m)     Head Circumference --      Peak Flow --      Pain Score 12/21/19 0926 6     Pain Loc --      Pain Edu? --      Excl. in [place]? --    Constitutional: Alert and oriented. Well appearing and in no acute distress. Cardiovascular: Normal rate, regular rhythm. Grossly normal heart sounds.  Good peripheral circulation. Respiratory: Normal respiratory effort.  No retractions. Lungs CTAB. Gastrointestinal: Soft and nontender. No distention. No abdominal bruits. No CVA tenderness. Genitourinary: Deferred Musculoskeletal: No lower extremity tenderness nor edema.  No joint effusions. Neurologic:  Normal speech and language. No gross focal neurologic deficits are appreciated. No gait instability. Skin:  Skin is warm, dry and intact. No rash noted.  Nodular lesion right inguinal area. Psychiatric: Mood and affect are normal. Speech and behavior are normal.  ____________________________________________   LABS (all labs ordered are listed, but only abnormal results are displayed)  Labs Reviewed  CBC - Abnormal; Notable for the following  components:      Result Value   WBC 13.4 (*)    All other components within normal limits  COMPREHENSIVE METABOLIC PANEL - Abnormal; Notable for the following components:   Glucose, Bld 104 (*)    Total Protein 8.4 (*)    All other components within normal limits   ____________________________________________  EKG   ____________________________________________  RADIOLOGY I, [name], personally viewed and evaluated these images (plain radiographs) as part of my medical decision making, as well as reviewing the written  report by the radiologist.  ED MD interpretation:    Official radiology report(s): [place] RT LOWER EXTREM LTD SOFT TISSUE NON VASCULAR  Result Date: 12/21/2019 CLINICAL DATA:  29 year old male with possible infection EXAM: ULTRASOUND RIGHT LOWER EXTREMITY LIMITED TECHNIQUE: Ultrasound examination of the lower extremity soft tissues was performed in the area of clinical concern. COMPARISON:  None FINDINGS: Grayscale and color duplex performed in the region of clinical concern. Lentiform hypoechoic focus within the superficial soft tissues in the region clinical concern. This lentiform region measures 2.3 cm x 5 mm in thickness. A margin is in communication with a hypoechoic tract towards the skin surface, length of 1.61 cm. No internal color flow. There is the suggestion of some typical appearing lymph nodes deep to this lentiform collection IMPRESSION: Nonspecific lentiform hypoechoic region in the superficial soft tissues of the right inguinal region. Given the history this is presumably inflammation/infection, with the greatest diameter measuring 2.3 cm in length, potentially retained fluid/infection, versus edema or blood products. Electronically Signed   By: Gilmer Mor D.O.   On: 12/21/2019 13:05    ____________________________________________   PROCEDURES  Procedure(s) performed (including Critical Care):  Procedures   ____________________________________________   INITIAL IMPRESSION / ASSESSMENT AND PLAN / ED COURSE  As part of my medical decision making, I reviewed the following data within the electronic MEDICAL RECORD NUMBER         Patient presents for reevaluation of right inguinal abscess which was I&D 2 days ago.  Patient is concerned that the healing process is delayed.  Discussed ultrasound findings was consistent with ovulation/infection right inguinal area.  Advised patient to continue previous antibiotics.  Patient given additional home care instructions and a prescription  for naproxen.  Return if no improvement in 3 to 5 days or worsening of complaint.     ____________________________________________   FINAL CLINICAL IMPRESSION(S) / ED DIAGNOSES  Final diagnoses:  Soft tissue abscess of inguinal region     ED Discharge Orders         Ordered    naproxen (NAPROSYN) 500 MG tablet  2 times daily with meals        12/21/19 1332          *Please note:  Roberto Burns was evaluated in Emergency Department on 12/21/2019 for the symptoms described in the history of present illness. He was evaluated in the context of the global COVID-19 pandemic, which necessitated consideration that the patient might be at risk for infection with the SARS-CoV-2 virus that causes COVID-19. Institutional protocols and algorithms that pertain to the evaluation of patients at risk for COVID-19 are in a state of rapid change based on information released by regulatory bodies including the CDC and federal and state organizations. These policies and algorithms were followed during the patient's care in the ED.  Some ED evaluations and interventions may be delayed as a result of limited staffing during and the pandemic.*   Note:  This document was prepared using Dragon voice recognition software and may include unintentional dictation errors.    [name], PA-C 12/21/19 1335    Shaune Pollack, MD 12/21/19 2109

## 2019-12-21 NOTE — Discharge Instructions (Signed)
Go to the ER (per General Surgery).  You will likely need imaging and intravenous antibiotics.  Take care  Dr. Adriana Simas

## 2019-12-21 NOTE — ED Triage Notes (Signed)
Patient is being discharged from the Urgent Care and sent to the Emergency Department via POV . Per Dr. Adriana Simas, patient is in need of higher level of care due to abscess. Patient is aware and verbalizes understanding of plan of care.  Vitals:   12/21/19 0827  BP: (!) 143/78  Pulse: 83  Resp: 18  Temp: 98.8 F (37.1 C)  SpO2: 100%

## 2019-12-21 NOTE — ED Triage Notes (Signed)
Patient states he was here on Sunday and had an abscess drained in the right groin area. He states the area is swelling more and he has had a fever. He has not removed the packing yet.

## 2021-01-01 ENCOUNTER — Encounter: Payer: Self-pay | Admitting: Emergency Medicine

## 2021-01-01 ENCOUNTER — Other Ambulatory Visit: Payer: Self-pay

## 2021-01-01 ENCOUNTER — Ambulatory Visit
Admission: EM | Admit: 2021-01-01 | Discharge: 2021-01-01 | Disposition: A | Discharge status: Home / Self Care | Payer: BLUE CROSS/BLUE SHIELD | Attending: Physician Assistant | Admitting: Physician Assistant

## 2021-01-01 DIAGNOSIS — R509 Fever, unspecified: Secondary | ICD-10-CM

## 2021-01-01 DIAGNOSIS — J45909 Unspecified asthma, uncomplicated: Secondary | ICD-10-CM | POA: Diagnosis not present

## 2021-01-01 DIAGNOSIS — J101 Influenza due to other identified influenza virus with other respiratory manifestations: Secondary | ICD-10-CM | POA: Insufficient documentation

## 2021-01-01 DIAGNOSIS — R051 Acute cough: Secondary | ICD-10-CM | POA: Diagnosis present

## 2021-01-01 DIAGNOSIS — Z20822 Contact with and (suspected) exposure to covid-19: Secondary | ICD-10-CM | POA: Insufficient documentation

## 2021-01-01 LAB — RESP PANEL BY RT-PCR (FLU A&B, COVID) ARPGX2
Influenza A by PCR: POSITIVE — AB
Influenza B by PCR: NEGATIVE
SARS Coronavirus 2 by RT PCR: NEGATIVE

## 2021-01-01 MED ORDER — IBUPROFEN 600 MG PO TABS
600.0000 mg | ORAL_TABLET | Freq: Once | ORAL | Status: AC
  Administered 2021-01-01: 600 mg via ORAL

## 2021-01-01 MED ORDER — OSELTAMIVIR PHOSPHATE 75 MG PO CAPS: 75.0000 mg | ORAL_CAPSULE | Freq: Two times a day (BID) | ORAL | 0 refills | Status: AC

## 2021-01-01 MED ORDER — PSEUDOEPH-BROMPHEN-DM 30-2-10 MG/5ML PO SYRP: 10.0000 mL | ORAL_SOLUTION | Freq: Four times a day (QID) | ORAL | 0 refills | Status: AC | PRN

## 2021-01-01 NOTE — ED Provider Notes (Signed)
[facility]    CSN: [id number] Arrival date & time: 01/01/21  1549      History   Chief Complaint Chief Complaint  Patient presents with   Fever   Cough    HPI Roberto Burns is a 30 y.o. male presenting for onset of fever of 103 degrees, fatigue, body aches, cough and congestion yesterday.  Patient says he feels short of breath like he has bronchitis.  Patient has taken Advil for symptoms.  Last dose of Advil was several hours ago and temp is currently 102.9 degrees.  Denies any sick contacts or known exposure to influenza or COVID-19.  He has not had any sore throat, headaches, vomiting or diarrhea.  History of asthma.  No other complaints.  HPI  Past Medical History:  Diagnosis Date   Allergy    Asthma     There are no problems to display for this patient.   Past Surgical History:  Procedure Laterality Date   WRIST FRACTURE SURGERY Left        Home Medications    Prior to Admission medications   Medication Sig Start Date End Date Taking? Authorizing Provider  brompheniramine-pseudoephedrine-DM 30-2-10 MG/5ML syrup Take 10 mLs by mouth 4 (four) times daily as needed for up to 7 days. 01/01/21 01/08/21 Yes Shirlee Latch, PA-C  oseltamivir (TAMIFLU) 75 MG capsule Take 1 capsule (75 mg total) by mouth every 12 (twelve) hours for 5 days. 01/01/21 01/06/21 Yes [name], PA-C  doxycycline (VIBRAMYCIN) 100 MG capsule Take 1 capsule (100 mg total) by mouth 2 (two) times daily. 12/19/19   [name], DO  naproxen (NAPROSYN) 500 MG tablet Take 1 tablet (500 mg total) by mouth 2 (two) times daily with a meal. 12/21/19   [name], PA-C  fluticasone [facility]) 50 MCG/ACT nasal spray Place 2 sprays into both nostrils daily. 02/02/17 12/19/19  Lutricia Feil, PA-C    Family History Family History  Problem Relation Age of Onset   Diabetes Mother    Hypertension Mother     Social History Social History   Tobacco Use   Smoking  status: Every Day    Packs/day: 0.50    Types: Cigarettes   Smokeless tobacco: Never  Vaping Use   Vaping Use: Every day  Substance Use Topics   Alcohol use: Yes    Comment: 1-2   Drug use: No     Allergies   Patient has no known allergies.   Review of Systems Review of Systems  Constitutional:  Positive for fatigue and fever.  HENT:  Positive for congestion. Negative for rhinorrhea, sinus pressure, sinus pain and sore throat.   Respiratory:  Positive for cough. Negative for shortness of breath.   Gastrointestinal:  Negative for abdominal pain, diarrhea, nausea and vomiting.  Musculoskeletal:  Positive for myalgias.  Neurological:  Negative for weakness, light-headedness and headaches.  Hematological:  Negative for adenopathy.    Physical Exam Triage Vital Signs ED Triage Vitals  Enc Vitals Group     BP 01/01/21 1732 (!) 166/98     Pulse Rate 01/01/21 1732 100     Resp 01/01/21 1732 18     Temp 01/01/21 1732 (!) 102.9 F (39.4 C)     Temp Source 01/01/21 1732 Oral     SpO2 01/01/21 1732 100 %     Weight 01/01/21 1731 214 lb 15.2 oz (97.5 kg)     Height 01/01/21 1731 5\' 9"  (1.753 m)  Head Circumference --      Peak Flow --      Pain Score 01/01/21 1730 5     Pain Loc --      Pain Edu? --      Excl. in GC? --    No data found.  Updated Vital Signs BP (!) 166/98 (BP Location: Left Arm)   Pulse 100   Temp (!) 102.9 F (39.4 C) (Oral)   Resp 18   Ht 5\' 9"  (1.753 m)   Wt 214 lb 15.2 oz (97.5 kg)   SpO2 100%   BMI 31.74 kg/m      Physical Exam Vitals and nursing note reviewed.  Constitutional:      General: He is not in acute distress.    Appearance: Normal appearance. He is well-developed. He is ill-appearing.  HENT:     Head: Normocephalic and atraumatic.     Nose: Congestion present.     Mouth/Throat:     Mouth: Mucous membranes are moist.     Pharynx: Oropharynx is clear.  Eyes:     General: No scleral icterus.    Conjunctiva/sclera:  Conjunctivae normal.  Cardiovascular:     Rate and Rhythm: Regular rhythm. Tachycardia present.     Heart sounds: Normal heart sounds.  Pulmonary:     Effort: Pulmonary effort is normal. No respiratory distress.     Breath sounds: Normal breath sounds.  Musculoskeletal:        General: No swelling.     Cervical back: Neck supple.  Skin:    General: Skin is warm and dry.     Capillary Refill: Capillary refill takes less than 2 seconds.  Neurological:     General: No focal deficit present.     Mental Status: He is alert. Mental status is at baseline.     Motor: No weakness.     Coordination: Coordination normal.     Gait: Gait normal.  Psychiatric:        Mood and Affect: Mood normal.        Behavior: Behavior normal.        Thought Content: Thought content normal.     UC Treatments / Results  Labs (all labs ordered are listed, but only abnormal results are displayed) Labs Reviewed  RESP PANEL BY RT-PCR (FLU A&B, COVID) ARPGX2 - Abnormal; Notable for the following components:      Result Value   Influenza A by PCR POSITIVE (*)    All other components within normal limits    EKG   Radiology No results found.  Procedures Procedures (including critical care time)  Medications Ordered in UC Medications  ibuprofen (ADVIL) tablet 600 mg (600 mg Oral Given 01/01/21 1742)    Initial Impression / Assessment and Plan / UC Course  I have reviewed the triage vital signs and the nursing notes.  Pertinent labs & imaging results that were available during my care of the patient were reviewed by me and considered in my medical decision making (see chart for details).  30 year old male presenting for fever, fatigue, body aches, cough, congestion.  Symptom onset yesterday.  BP elevated at 166/98.  Temp currently 102.8 degrees.  Remainder vitals are stable.  On exam he is ill-appearing but nontoxic.  Nasal congestion present.  Chest clear to auscultation heart regular rate and  rhythm.  Patient given 60 mg Advil in clinic for fever.  Respiratory panel obtained to assess for influenza or COVID-19.  Positive influenza A.  Discussed  result with patient.  Patient within window for Tamiflu so I sent that to pharmacy as well as Bromfed-DM.  Reviewed how much and how often to take antipyretics for fever.  Advise increasing rest and fluids.  Reviewed return and ER precautions and work note provided.   Final Clinical Impressions(s) / UC Diagnoses   Final diagnoses:  Influenza A  Acute cough  Fever, unspecified     Discharge Instructions      -You have influenza A.  I have sent Tamiflu to the pharmacy.  Hopefully this shortens the course by a day and make sure symptoms little more mild. - You will still need to treat your symptoms so I have sent a cough medicine.  Additionally you will need to treat your fever.  You can take 600 mg ibuprofen every 6 hours.  You may need to take Tylenol in between that.  Can take 500 mg Tylenol every 6 hours.  Increase rest and fluids. - You should stay home until you have broken the fever.  You can go back to work after you have been fever free greater than 24 hours without taking Tylenol or Motrin.  Most people feel better within a week or so. - You should be seen again sooner for any severe acute worsening of her symptoms such as any uncontrollable fever, weakness, breathing difficulty.     ED Prescriptions     Medication Sig Dispense Auth. Provider   oseltamivir (TAMIFLU) 75 MG capsule Take 1 capsule (75 mg total) by mouth every 12 (twelve) hours for 5 days. 10 capsule [name], PA-C   brompheniramine-pseudoephedrine-DM 30-2-10 MG/5ML syrup Take 10 mLs by mouth 4 (four) times daily as needed for up to 7 days. 150 mL Shirlee Latch, PA-C      PDMP not reviewed this encounter.   Shirlee Latch, PA-C 01/01/21 1827

## 2021-01-01 NOTE — Discharge Instructions (Signed)
-  You have influenza A.  I have sent Tamiflu to the pharmacy.  Hopefully this shortens the course by a day and make sure symptoms little more mild. - You will still need to treat your symptoms so I have sent a cough medicine.  Additionally you will need to treat your fever.  You can take 600 mg ibuprofen every 6 hours.  You may need to take Tylenol in between that.  Can take 500 mg Tylenol every 6 hours.  Increase rest and fluids. - You should stay home until you have broken the fever.  You can go back to work after you have been fever free greater than 24 hours without taking Tylenol or Motrin.  Most people feel better within a week or so. - You should be seen again sooner for any severe acute worsening of her symptoms such as any uncontrollable fever, weakness, breathing difficulty.

## 2021-01-01 NOTE — ED Triage Notes (Signed)
Pt c/o cough, fever (tmax 103), nasal congestion, body aches. Started yesterday.

## 2022-03-04 ENCOUNTER — Emergency Department: Payer: 59

## 2022-03-04 ENCOUNTER — Other Ambulatory Visit: Payer: Self-pay

## 2022-03-04 ENCOUNTER — Encounter: Payer: Self-pay | Admitting: Emergency Medicine

## 2022-03-04 ENCOUNTER — Emergency Department
Admission: EM | Admit: 2022-03-04 | Discharge: 2022-03-04 | Disposition: A | Discharge status: Home / Self Care | Payer: 59 | Attending: Emergency Medicine | Admitting: Emergency Medicine

## 2022-03-04 DIAGNOSIS — R42 Dizziness and giddiness: Secondary | ICD-10-CM | POA: Diagnosis not present

## 2022-03-04 DIAGNOSIS — R079 Chest pain, unspecified: Secondary | ICD-10-CM | POA: Diagnosis present

## 2022-03-04 DIAGNOSIS — F419 Anxiety disorder, unspecified: Secondary | ICD-10-CM | POA: Diagnosis not present

## 2022-03-04 LAB — BASIC METABOLIC PANEL
Anion gap: 11 (ref 5–15)
BUN: 13 mg/dL (ref 6–20)
CO2: 22 mmol/L (ref 22–32)
Calcium: 9.6 mg/dL (ref 8.9–10.3)
Chloride: 103 mmol/L (ref 98–111)
Creatinine, Ser: 1.1 mg/dL (ref 0.61–1.24)
GFR, Estimated: >60 mL/min (ref >60)
Glucose, Bld: 101 mg/dL — ABNORMAL HIGH (ref 70–99)
Potassium: 3.9 mmol/L (ref 3.5–5.1)
Sodium: 136 mmol/L (ref 135–145)

## 2022-03-04 LAB — CBC
HCT: 40.9 % (ref 39.0–52.0)
Hemoglobin: 13.7 g/dL (ref 13.0–17.0)
MCH: 30.9 pg (ref 26.0–34.0)
MCHC: 33.5 g/dL (ref 30.0–36.0)
MCV: 92.1 fL (ref 80.0–100.0)
Platelets: 259 10*3/uL (ref 150–400)
RBC: 4.44 MIL/uL (ref 4.22–5.81)
RDW: 13.1 % (ref 11.5–15.5)
WBC: 6.6 10*3/uL (ref 4.0–10.5)
nRBC: 0 % (ref 0.0–0.2)

## 2022-03-04 LAB — TROPONIN I (HIGH SENSITIVITY): Troponin I (High Sensitivity): 4 ng/L (ref <18)

## 2022-03-04 MED ORDER — IBUPROFEN 600 MG PO TABS
600.0000 mg | ORAL_TABLET | Freq: Once | ORAL | Status: AC
  Administered 2022-03-04: 600 mg via ORAL
  Filled 2022-03-04: qty 1

## 2022-03-04 MED ORDER — HYDROXYZINE HCL 25 MG PO TABS: 25.0000 mg | ORAL_TABLET | Freq: Three times a day (TID) | ORAL | 0 refills | Status: AC | PRN

## 2022-03-04 NOTE — ED Triage Notes (Signed)
Patient to ED via [facility] from home for chest pain and lightheadedness that started around 0730 this AM. Left sided CP described as a discomfort. Hx of anxiety. Denies cardiac history.

## 2022-03-04 NOTE — ED Triage Notes (Signed)
Arrives via [facility].  While driving patient had an episode of CP.  Per report, patient is reproducible and also c/o tingling to extremities and dizziness.  Patient has history of anxiety and patient feels this could be a panic attack. Per report, VS wnl.  EKG wnl.  Patient is aAOx3.  Skin warm and dry. No SOB/ DOE.  NAD

## 2022-03-04 NOTE — ED Provider Notes (Signed)
[facility] Provider Note    Event Date/Time   First MD Initiated Contact with Patient 03/04/22 [phone]     (approximate)  History   Chief Complaint: Chest Pain  HPI  Roberto Burns is a 32 y.o. male with no significant past medical history presents to the emergency department for chest pain as well as tingling and dizziness.  According to the patient he was driving to work this morning around 730 when he began feeling a pain in his chest followed by a tingling in his hands and feet lightheadedness/dizziness.  Patient states this is the third or fourth time he has experienced these symptoms over the past few years.  Patient states he thinks this is a "panic attack."  Patient states as soon as the ambulance arrived he felt better and his symptoms resolved.  Denies any nausea or diaphoresis at any point.  No personal or family history of cardiac disease.  Physical Exam   Triage Vital Signs: ED Triage Vitals  Enc Vitals Group     BP 03/04/22 0902 (!) 136/105     Pulse Rate 03/04/22 0902 70     Resp 03/04/22 0902 18     Temp 03/04/22 0902 98.2 F (36.8 C)     Temp Source 03/04/22 0902 Oral     SpO2 03/04/22 0902 100 %     Weight 03/04/22 0954 214 lb 15.2 oz (97.5 kg)     Height 03/04/22 0954 5\' 9"  (1.753 m)     Head Circumference --      Peak Flow --      Pain Score 03/04/22 0859 2     Pain Loc --      Pain Edu? --      Excl. in [place]? --     Most recent vital signs: Vitals:   03/04/22 0902  BP: (!) 136/105  Pulse: 70  Resp: 18  Temp: 98.2 F (36.8 C)  SpO2: 100%    General: Awake, no distress.  CV:  Good peripheral perfusion.  Regular rate and rhythm  Resp:  Normal effort.  Equal breath sounds bilaterally.  Abd:  No distention.  Soft, nontender.  No rebound or guarding.  ED Results / Procedures / Treatments   EKG  EKG viewed and interpreted by myself shows a normal sinus rhythm at 68 bpm with a narrow QRS, normal axis, normal  intervals, no concerning ST changes.  RADIOLOGY  I have reviewed and interpreted the chest x-ray images.  No consolidation seen on my evaluation. Radiology is read the chest x-ray as negative.   MEDICATIONS ORDERED IN ED: Medications  ibuprofen (ADVIL) tablet 600 mg (600 mg Oral Given 03/04/22 1000)     IMPRESSION / MDM / ASSESSMENT AND PLAN / ED COURSE  I reviewed the triage vital signs and the nursing notes.  Patient's presentation is most consistent with acute presentation with potential threat to life or bodily function.  Patient presents emergency department for chest pain, tingling and dizziness.  Overall the patient appears well, no distress.  Reassuring vitals besides mild hypertension, reassuring physical exam.  Patient's chest x-ray is clear and EKG is reassuring as well.  Patient denies any symptoms currently.  States this is the third or fourth time he has had similar symptoms.  Symptoms are suggestive of panic attacks/disorder.  We will check labs as a precaution including cardiac enzymes.  Will continue to closely monitor.  Patient agreeable to plan of care.  Will dose  ibuprofen as the patient recently had a tooth extraction and states he did not take any ibuprofen or Tylenol this morning and he is having dental pain.  Workup is reassuring including normal CBC normal chemistry negative troponin.  Chest x-ray is clear and EKG reassuring.  Given the patient's reassuring workup and symptoms that appear more consistent with anxiety/panic disorder patient will be discharged from the emergency department with a prescription for hydroxyzine.  However I did discuss and provided my typical chest pain return precautions.  Patient understands and is agreeable to plan of care.  FINAL CLINICAL IMPRESSION(S) / ED DIAGNOSES   Chest pain Anxiety  Rx / DC Orders   Hydroxyzine  Note:  This document was prepared using Dragon voice recognition software and may include unintentional dictation  errors.   Harvest Dark, MD 03/05/22 [phone]

## 2022-06-18 IMAGING — US US EXTREM LOW*R* LIMITED
1 series · 12 of 12 positions shown · non-contrast
Comparison: None

CLINICAL DATA: 29-year-old male with possible infection

EXAM:
ULTRASOUND RIGHT LOWER EXTREMITY LIMITED
TECHNIQUE: Ultrasound examination of the lower extremity soft tissues was
performed in the area of clinical concern.

[Series 1: us soft tissue lower extremity limited right (non- · 12 acquisitions, 12 frames shown]
[im 1/12]
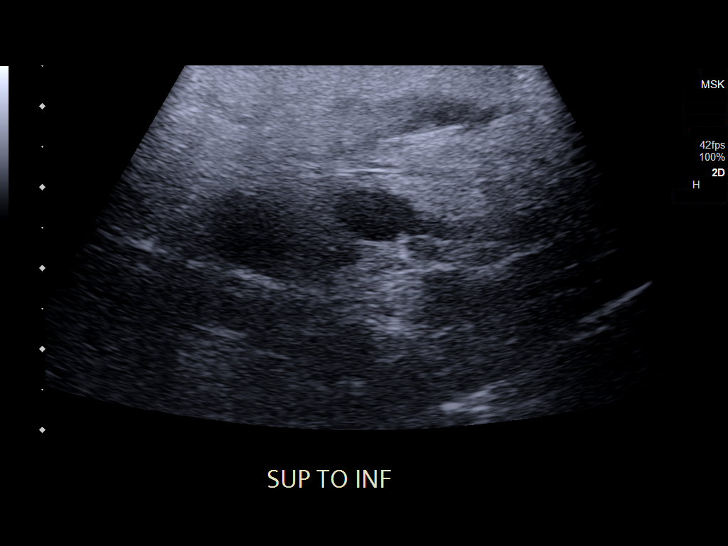
[im 2/12]
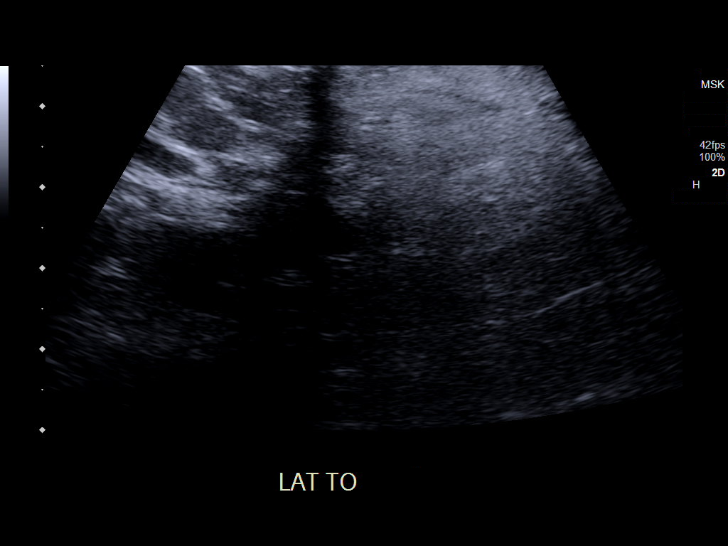
[im 3/12]
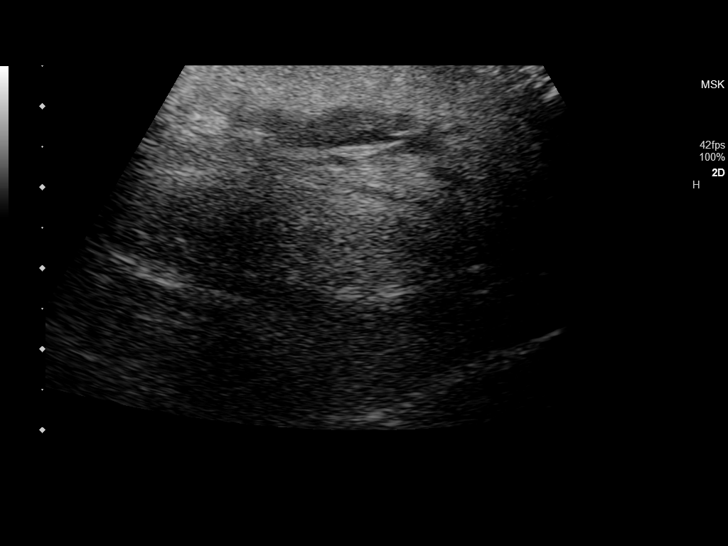
[im 4/12]
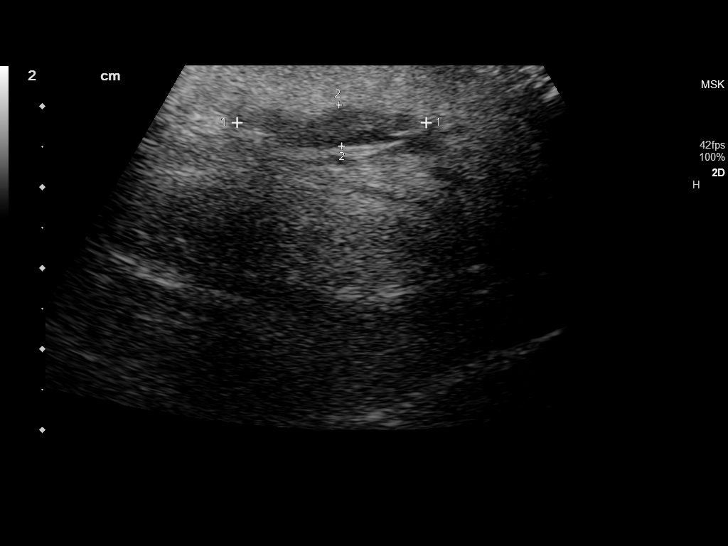
[im 5/12]
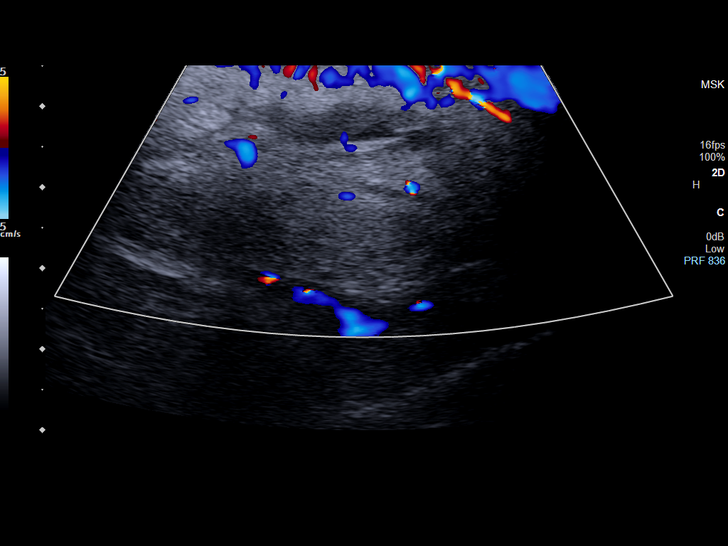
[im 6/12]
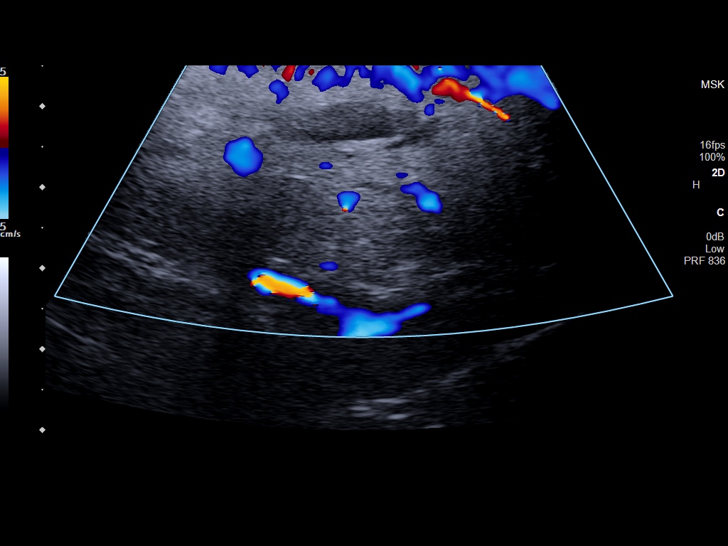
[im 7/12]
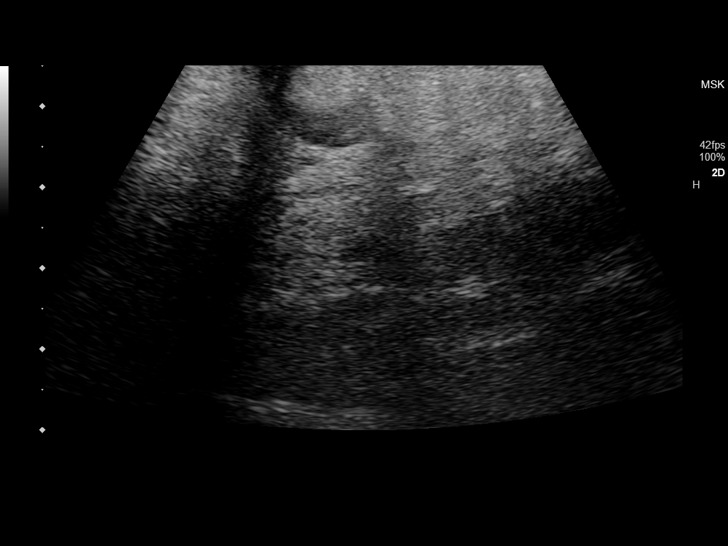
[im 8/12]
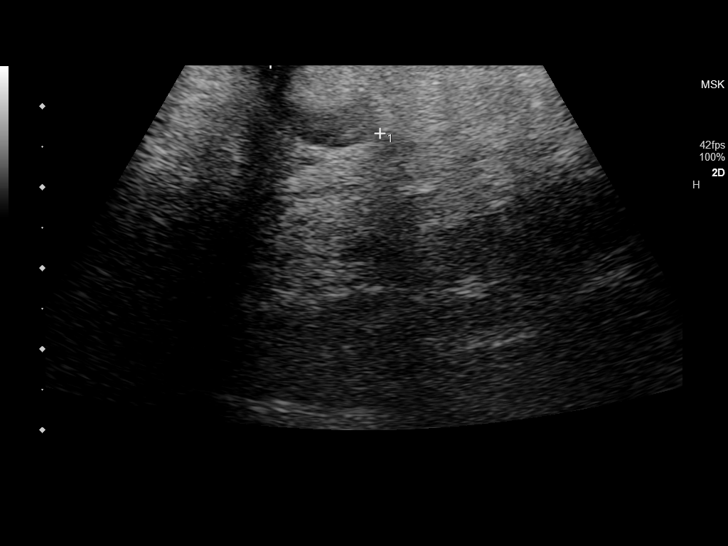
[im 9/12]
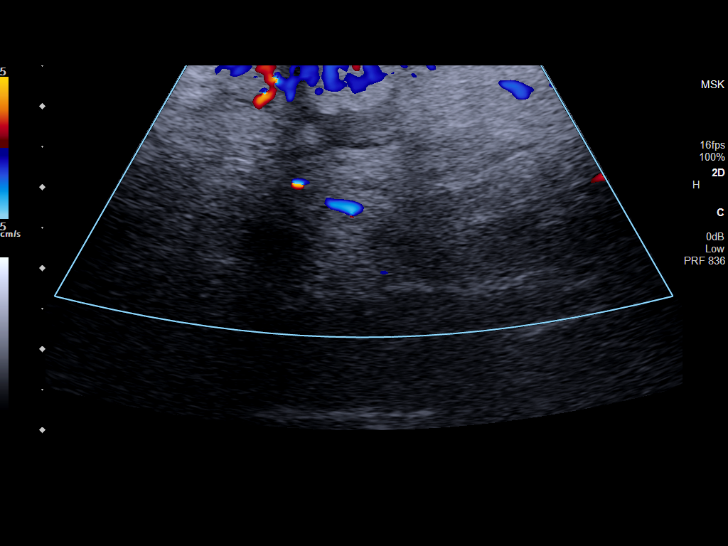
[im 10/12]
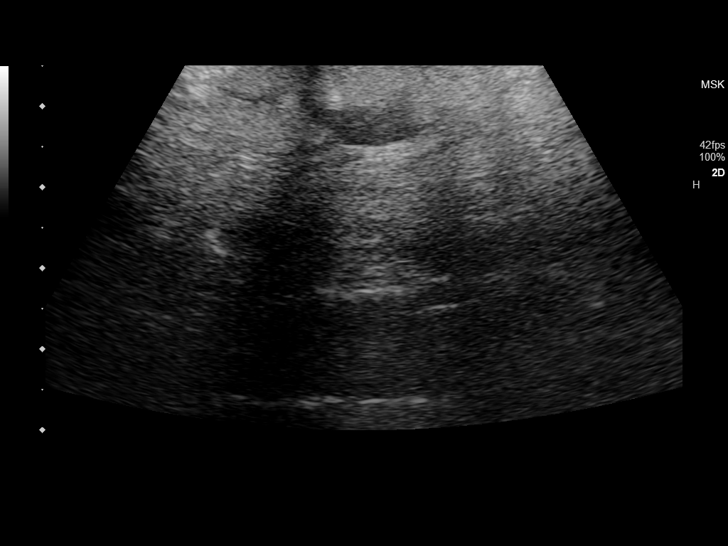
[im 11/12]
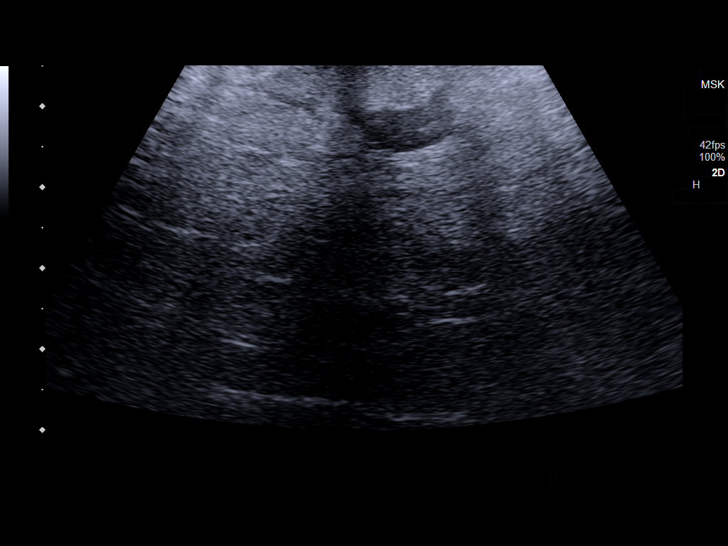
[im 12/12]
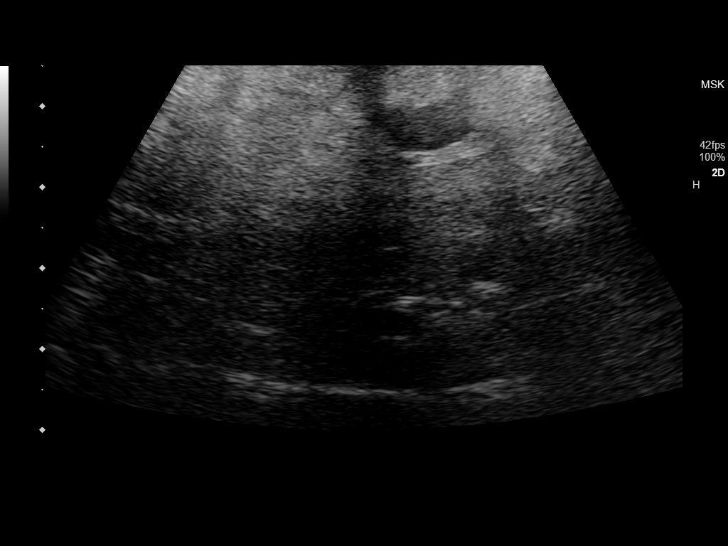

[12 of 12 positions shown; findings below may reference images not displayed]

FINDINGS: Grayscale and color duplex performed in the region of clinical
concern.

Lentiform hypoechoic focus within the superficial soft tissues in
the region clinical concern. This lentiform region measures 2.3 cm x
5 mm in thickness. A margin is in communication with a hypoechoic
tract towards the skin surface, length of 1.61 cm. No internal color
flow. There is the suggestion of some typical appearing lymph nodes
deep to this lentiform collection
IMPRESSION: Nonspecific lentiform hypoechoic region in the superficial soft
tissues of the right inguinal region. Given the history this is
presumably inflammation/infection, with the greatest diameter
measuring 2.3 cm in length, potentially retained fluid/infection,
versus edema or blood products.

## 2023-01-19 ENCOUNTER — Ambulatory Visit
Admission: EM | Admit: 2023-01-19 | Discharge: 2023-01-19 | Disposition: A | Discharge status: Home / Self Care | Payer: 59 | Attending: Emergency Medicine | Admitting: Emergency Medicine

## 2023-01-19 ENCOUNTER — Encounter: Payer: Self-pay | Admitting: Emergency Medicine

## 2023-01-19 DIAGNOSIS — Z113 Encounter for screening for infections with a predominantly sexual mode of transmission: Secondary | ICD-10-CM | POA: Diagnosis not present

## 2023-01-19 DIAGNOSIS — Z202 Contact with and (suspected) exposure to infections with a predominantly sexual mode of transmission: Secondary | ICD-10-CM | POA: Diagnosis present

## 2023-01-19 LAB — HIV ANTIBODY (ROUTINE TESTING W REFLEX): HIV Screen 4th Generation wRfx: NONREACTIVE

## 2023-01-19 MED ORDER — METRONIDAZOLE 500 MG PO TABS: 2000.0000 mg | ORAL_TABLET | Freq: Once | ORAL | 0 refills | Status: AC

## 2023-01-19 NOTE — ED Triage Notes (Signed)
Patient states that his current partner tested positive for Trich.  Patient is here to be tested for this.  Patient denies any symptoms.

## 2023-01-19 NOTE — Discharge Instructions (Addendum)
Please take med as directed(flagyl), treating you for trichomononas. Avoid sexual activity until you and partner(s) are treated and without symptoms and results known if further treatment is indicated. . Practice safe sex with all future sexual activity. Drink plenty of water. Check my chart for results. If additional medication is needed,treatment you will be contacted. Follow up with PCP. Return as needed.

## 2023-01-19 NOTE — ED Provider Notes (Signed)
[facility]    CSN: [id number] Arrival date & time: 01/19/23  1007      History   Chief Complaint Chief Complaint  Patient presents with   Exposure to STD    HPI Roberto Burns is a 32 y.o. male.   32 year old male patient, Roberto Burns, presents to urgent care for STD testing.  Patient states his current partner(wife) tested positive for trichomoniasis, patient denies additional partners, patient denies any symptoms.  Requesting HIV and syphilis testing as well.     The history is provided by the patient. No language interpreter was used.    Past Medical History:  Diagnosis Date   Allergy    Asthma     Patient Active Problem List   Diagnosis Date Noted   Trichomonas exposure 01/19/2023   Screen for STD (sexually transmitted disease) 01/19/2023    Past Surgical History:  Procedure Laterality Date   WRIST FRACTURE SURGERY Left        Home Medications    Prior to Admission medications   Medication Sig Start Date End Date Taking? Authorizing Provider  metroNIDAZOLE (FLAGYL) 500 MG tablet Take 4 tablets (2,000 mg total) by mouth once for 1 dose. 01/19/23 01/19/23 Yes Hasan Douse, Para March, NP  doxycycline (VIBRAMYCIN) 100 MG capsule Take 1 capsule (100 mg total) by mouth 2 (two) times daily. 12/19/19   [name], DO  hydrOXYzine (ATARAX) 25 MG tablet Take 1 tablet (25 mg total) by mouth 3 (three) times daily as needed for anxiety. 03/04/22   Minna Antis, MD  naproxen (NAPROSYN) 500 MG tablet Take 1 tablet (500 mg total) by mouth 2 (two) times daily with a meal. 12/21/19   [name], PA-C  fluticasone [facility]) 50 MCG/ACT nasal spray Place 2 sprays into both nostrils daily. 02/02/17 12/19/19  Lutricia Feil, PA-C    Family History Family History  Problem Relation Age of Onset   Diabetes Mother    Hypertension Mother     Social History Social History   Tobacco Use   Smoking status: Every Day    Current  packs/day: 0.50    Types: Cigarettes   Smokeless tobacco: Never  Vaping Use   Vaping status: Every Day  Substance Use Topics   Alcohol use: Yes    Comment: 1-2   Drug use: No     Allergies   Patient has no known allergies.   Review of Systems Review of Systems  Constitutional:  Negative for fever.  Gastrointestinal:  Negative for abdominal pain.  Genitourinary:  Negative for decreased urine volume, difficulty urinating, dysuria, enuresis, flank pain, frequency, genital sores, hematuria, penile discharge, penile pain, penile swelling, scrotal swelling, testicular pain and urgency.  All other systems reviewed and are negative.    Physical Exam Triage Vital Signs ED Triage Vitals  Encounter Vitals Group     BP 01/19/23 1030 (!) 154/109     Systolic BP Percentile --      Diastolic BP Percentile --      Pulse Rate 01/19/23 1030 98     Resp 01/19/23 1030 15     Temp 01/19/23 1030 99.1 F (37.3 C)     Temp Source 01/19/23 1030 Oral     SpO2 01/19/23 1030 99 %     Weight 01/19/23 1029 214 lb 15.2 oz (97.5 kg)     Height 01/19/23 1029 5\' 9"  (1.753 m)     Head Circumference --      Peak Flow --  Pain Score 01/19/23 1029 0     Pain Loc --      Pain Education --      Exclude from Growth Chart --    No data found.  Updated Vital Signs BP (!) 154/109 (BP Location: Right Arm)   Pulse 98   Temp 99.1 F (37.3 C) (Oral)   Resp 15   Ht 5\' 9"  (1.753 m)   Wt 214 lb 15.2 oz (97.5 kg)   SpO2 99%   BMI 31.74 kg/m   Visual Acuity Right Eye Distance:   Left Eye Distance:   Bilateral Distance:    Right Eye Near:   Left Eye Near:    Bilateral Near:     Physical Exam Vitals and nursing note reviewed.  Constitutional:      General: He is not in acute distress.    Appearance: He is well-developed and well-groomed.  HENT:     Head: Normocephalic and atraumatic.  Eyes:     Conjunctiva/sclera: Conjunctivae normal.  Cardiovascular:     Rate and Rhythm: Normal rate.      Heart sounds: No murmur heard. Pulmonary:     Effort: Pulmonary effort is normal. No respiratory distress.  Abdominal:     Palpations: Abdomen is soft.     Tenderness: There is no abdominal tenderness.  Genitourinary:    Comments: Patient denies symptoms, exam deferred, patient self swab Musculoskeletal:        General: No swelling.     Cervical back: Neck supple.  Skin:    General: Skin is warm and dry.     Capillary Refill: Capillary refill takes less than 2 seconds.  Neurological:     General: No focal deficit present.     Mental Status: He is alert and oriented to person, place, and time.     GCS: GCS eye subscore is 4. GCS verbal subscore is 5. GCS motor subscore is 6.  Psychiatric:        Attention and Perception: Attention normal.        Mood and Affect: Mood is anxious.        Speech: Speech normal.        Behavior: Behavior is cooperative.      UC Treatments / Results  Labs (all labs ordered are listed, but only abnormal results are displayed) Labs Reviewed  HIV ANTIBODY (ROUTINE TESTING W REFLEX)  RPR  CYTOLOGY, (ORAL, ANAL, URETHRAL) ANCILLARY ONLY    EKG   Radiology No results found.  Procedures Procedures (including critical care time)  Medications Ordered in UC Medications - No data to display  Initial Impression / Assessment and Plan / UC Course  I have reviewed the triage vital signs and the nursing notes.  Pertinent labs & imaging results that were available during my care of the patient were reviewed by me and considered in my medical decision making (see chart for details).  Clinical Course as of 01/19/23 1450  Sun Jan 19, 2023  1117 Pt was treated on 12/29/22 for chlamydia at [facility] urgent care.  [JD]    Clinical Course User Index [JD] Hildagard Sobecki, Para March, NP   Discussed exam findings and plan of care with patient, patient aware we will treat him for trichomonas today due to positive exposure, patient awaiting results  for remainder of STI testing, aware if additional treatment is needed we will notify him.  Patient verbalized understanding to this provider, safe sex handout/information  given  Ddx: Trichomonas exposure, STD screening  Final Clinical Impressions(s) / UC Diagnoses   Final diagnoses:  Trichomonas exposure  Screen for STD (sexually transmitted disease)     Discharge Instructions      Please take med as directed(flagyl), treating you for trichomononas. Avoid sexual activity until you and partner(s) are treated and without symptoms and results known if further treatment is indicated. . Practice safe sex with all future sexual activity. Drink plenty of water. Check my chart for results. If additional medication is needed,treatment you will be contacted. Follow up with PCP. Return as needed.     ED Prescriptions     Medication Sig Dispense Auth. Provider   metroNIDAZOLE (FLAGYL) 500 MG tablet Take 4 tablets (2,000 mg total) by mouth once for 1 dose. 4 tablet Arnoldo Hildreth, Para March, NP      PDMP not reviewed this encounter.   Clancy Gourd, NP 01/19/23 1450

## 2023-01-20 ENCOUNTER — Ambulatory Visit: Payer: 59

## 2023-01-20 LAB — RPR: RPR Ser Ql: NONREACTIVE

## 2023-01-21 LAB — CYTOLOGY, (ORAL, ANAL, URETHRAL) ANCILLARY ONLY
Chlamydia: NEGATIVE
Comment: NEGATIVE
Comment: NEGATIVE
Comment: NORMAL
Neisseria Gonorrhea: NEGATIVE
Trichomonas: POSITIVE — AB

## 2024-01-15 ENCOUNTER — Telehealth: Admitting: Family Medicine

## 2024-01-15 DIAGNOSIS — J208 Acute bronchitis due to other specified organisms: Secondary | ICD-10-CM | POA: Diagnosis not present

## 2024-01-15 MED ORDER — AZITHROMYCIN 250 MG PO TABS: ORAL_TABLET | ORAL | 0 refills | Status: AC

## 2024-01-15 NOTE — Progress Notes (Signed)
 We are sorry that you are not feeling well.  Here is how we plan to help!  Based on your presentation I believe you most likely have A cough due to a virus.  This is called viral bronchitis and is best treated by rest, plenty of fluids and control of the cough.  You may use Ibuprofen  or Tylenol as directed to help your symptoms.     In addition you may use A prescription cough medication called Tessalon Perles 100mg . You may take 1-2 capsules every 8 hours as needed for your cough.  From your responses in the eVisit questionnaire you describe inflammation in the upper respiratory tract which is causing a significant cough.  This is commonly called Bronchitis and has four common causes:   Allergies Viral Infections Acid Reflux Bacterial Infection Allergies, viruses and acid reflux are treated by controlling symptoms or eliminating the cause. An example might be a cough caused by taking certain blood pressure medications. You stop the cough by changing the medication. Another example might be a cough caused by acid reflux. Controlling the reflux helps control the cough.  USE OF BRONCHODILATOR (RESCUE) INHALERS: There is a risk from using your bronchodilator too frequently.  The risk is that over-reliance on a medication which only relaxes the muscles surrounding the breathing tubes can reduce the effectiveness of medications prescribed to reduce swelling and congestion of the tubes themselves.  Although you feel brief relief from the bronchodilator inhaler, your asthma may actually be worsening with the tubes becoming more swollen and filled with mucus.  This can delay other crucial treatments, such as oral steroid medications. If you need to use a bronchodilator inhaler daily, several times per day, you should discuss this with your provider.  There are probably better treatments that could be used to keep your asthma under control.     HOME CARE Only take medications as instructed by your medical  team. Complete the entire course of an antibiotic. Drink plenty of fluids and get plenty of rest. Avoid close contacts especially the very young and the elderly Cover your mouth if you cough or cough into your sleeve. Always remember to wash your hands A steam or ultrasonic humidifier can help congestion.   GET HELP RIGHT AWAY IF: You develop worsening fever. You become short of breath You cough up blood. Your symptoms persist after you have completed your treatment plan MAKE SURE YOU  Understand these instructions. Will watch your condition. Will get help right away if you are not doing well or get worse.  Your e-visit answers were reviewed by a board certified advanced clinical practitioner to complete your personal care plan.  Depending on the condition, your plan could have included both over the counter or prescription medications. If there is a problem please reply  once you have received a response from your provider. Your safety is important to us .  If you have drug allergies check your prescription carefully.    You can use MyChart to ask questions about today's visit, request a non-urgent call back, or ask for a work or school excuse for 24 hours related to this e-Visit. If it has been greater than 24 hours you will need to follow up with your provider, or enter a new e-Visit to address those concerns. You will get an e-mail in the next two days asking about your experience.  I hope that your e-visit has been valuable and will speed your recovery. Thank you for using e-visits.  I have spent 5 minutes in review of e-visit questionnaire, review and updating patient chart, medical decision making and response to patient.   Elsie Velma Lunger, PA-C

## 2024-01-15 NOTE — Addendum Note (Signed)
 Addended by: GLADIS ELSIE BROCKS on: 01/15/2024 09:20 AM   Modules accepted: Orders

## 2024-02-09 ENCOUNTER — Ambulatory Visit: Admission: EM | Admit: 2024-02-09 | Discharge: 2024-02-09 | Disposition: A | Discharge status: Home / Self Care

## 2024-02-09 ENCOUNTER — Other Ambulatory Visit: Payer: Self-pay

## 2024-02-09 ENCOUNTER — Encounter: Payer: Self-pay | Admitting: *Deleted

## 2024-02-09 DIAGNOSIS — R202 Paresthesia of skin: Secondary | ICD-10-CM | POA: Diagnosis not present

## 2024-02-09 LAB — GLUCOSE, POCT (MANUAL RESULT ENTRY): POCT Glucose (KUC): 90 mg/dL (ref 70–99)

## 2024-02-09 NOTE — Discharge Instructions (Addendum)
 Schedule appointment for primary care for recheck.  Return if any problems.  Increase the water that you are drinking. Have your blood pressure rechecked

## 2024-02-09 NOTE — ED Provider Notes (Addendum)
 " [facility]    CSN: [id number] Arrival date & time: 02/09/24  1853      History   Chief Complaint Chief Complaint  Patient presents with   Tingling    HPI Roberto Burns is a 34 y.o. male.   Pt complains of tingling in his left hand and left foot.  Pt reports he has recently been feeling dizziness. Pt reports he has felt like he was going to pass out.  Pt has had symptoms for the past week.  Pt reports recent increased stress.  Pt is a smoker.  Pt denies any injury. No neck or back pain. No chest pain  The history is provided by the patient. No language interpreter was used.    Past Medical History:  Diagnosis Date   Allergy    Asthma     Patient Active Problem List   Diagnosis Date Noted   Trichomonas exposure 01/19/2023   Screen for STD (sexually transmitted disease) 01/19/2023    Past Surgical History:  Procedure Laterality Date   WRIST FRACTURE SURGERY Left        Home Medications    Prior to Admission medications  Medication Sig Start Date End Date Taking? Authorizing Provider  albuterol  (VENTOLIN  HFA) 108 (90 Base) MCG/ACT inhaler Inhale 2 puffs into the lungs every 4 (four) hours as needed. 02/20/15  Yes [provider]  benzonatate  (TESSALON ) 100 MG capsule Take 1 capsule (100 mg total) by mouth 3 (three) times daily as needed for cough. Patient not taking: Reported on 02/09/2024 01/15/24   Gladis Elsie BROCKS, PA-C  doxycycline  (VIBRAMYCIN ) 100 MG capsule Take 1 capsule (100 mg total) by mouth 2 (two) times daily. Patient not taking: Reported on 02/09/2024 12/19/19   Cook, Jayce G, DO  hydrOXYzine  (ATARAX ) 25 MG tablet Take 1 tablet (25 mg total) by mouth 3 (three) times daily as needed for anxiety. Patient not taking: Reported on 02/09/2024 03/04/22   Dorothyann Drivers, MD  naproxen  (NAPROSYN ) 500 MG tablet Take 1 tablet (500 mg total) by mouth 2 (two) times daily with a meal. Patient not taking: Reported on 02/09/2024  12/21/19   Claudene Tanda POUR, PA-C  fluticasone  (FLONASE ) 50 MCG/ACT nasal spray Place 2 sprays into both nostrils daily. 02/02/17 12/19/19  Lacinda Elsie SQUIBB, PA-C    Family History Family History  Problem Relation Age of Onset   Diabetes Mother    Hypertension Mother     Social History Social History[1]   Allergies   Patient has no known allergies.   Review of Systems Review of Systems  All other systems reviewed and are negative.    Physical Exam Triage Vital Signs ED Triage Vitals  Encounter Vitals Group     BP 02/09/24 1931 (!) 145/97     Girls Systolic BP Percentile --      Girls Diastolic BP Percentile --      Boys Systolic BP Percentile --      Boys Diastolic BP Percentile --      Pulse Rate 02/09/24 1931 80     Resp 02/09/24 1931 16     Temp 02/09/24 1931 97.8 F (36.6 C)     Temp Source 02/09/24 1931 Oral     SpO2 02/09/24 1931 98 %     Weight --      Height --      Head Circumference --      Peak Flow --      Pain Score 02/09/24 1925  0     Pain Loc --      Pain Education --      Exclude from Growth Chart --    No data found.  Updated Vital Signs BP (!) 145/97 (BP Location: Left Arm)   Pulse 80   Temp 97.8 F (36.6 C) (Oral)   Resp 16   SpO2 98%   Visual Acuity Right Eye Distance:   Left Eye Distance:   Bilateral Distance:    Right Eye Near:   Left Eye Near:    Bilateral Near:     Physical Exam Vitals and nursing note reviewed.  Constitutional:      Appearance: He is well-developed.  HENT:     Head: Normocephalic.     Right Ear: Tympanic membrane normal.     Left Ear: Tympanic membrane normal.     Nose: Nose normal.     Mouth/Throat:     Mouth: Mucous membranes are moist.  Eyes:     Pupils: Pupils are equal, round, and reactive to light.  Cardiovascular:     Rate and Rhythm: Normal rate.  Pulmonary:     Effort: Pulmonary effort is normal.  Abdominal:     General: There is no distension.  Musculoskeletal:        General:  Normal range of motion.     Cervical back: Normal range of motion.  Skin:    General: Skin is warm.  Neurological:     General: No focal deficit present.     Mental Status: He is alert and oriented to person, place, and time.  Psychiatric:        Mood and Affect: Mood normal.      UC Treatments / Results  Labs (all labs ordered are listed, but only abnormal results are displayed) Labs Reviewed  CBC WITH DIFFERENTIAL/PLATELET  BASIC METABOLIC PANEL WITH GFR  GLUCOSE, POCT (MANUAL RESULT ENTRY)    EKG   Radiology No results found.  Procedures Procedures (including critical care time)  Medications Ordered in UC Medications - No data to display  Initial Impression / Assessment and Plan / UC Course  I have reviewed the triage vital signs and the nursing notes.  Pertinent labs & imaging results that were available during my care of the patient were reviewed by me and considered in my medical decision making (see chart for details).      Final Clinical Impressions(s) / UC Diagnoses   Final diagnoses:  Tingling in extremities   Discharge Instructions   None    ED Prescriptions   None    PDMP not reviewed this encounter. An After Visit Summary was printed and given to the patient.       Flint Sonny POUR, PA-C 02/09/24 2001     [1]  Social History Tobacco Use   Smoking status: Every Day    Current packs/day: 0.50    Types: Cigarettes   Smokeless tobacco: Never  Vaping Use   Vaping status: Former  Substance Use Topics   Alcohol use: Yes    Comment: 4-5 times week   Drug use: No     Flint Sonny POUR DEVONNA 02/09/24 2011  "

## 2024-02-09 NOTE — ED Triage Notes (Signed)
 Pt reports intermittent episodes of dizziness and lightheadedness x 1 week, also tingling in his left hand and left foot, and SOB. Today I felt like I was going to pass out while I was at working sitting down talking to someone. This occurred about 30-45 minutes pta. Symptoms have improved but not gone

## 2024-02-11 ENCOUNTER — Ambulatory Visit (HOSPITAL_COMMUNITY): Payer: Self-pay

## 2024-02-11 LAB — CBC WITH DIFFERENTIAL/PLATELET
Basophils Absolute: 0.1 x10E3/uL (ref 0.0–0.2)
Basos: 1 %
EOS (ABSOLUTE): 0.1 x10E3/uL (ref 0.0–0.4)
Eos: 2 %
Hematocrit: 41.8 % (ref 37.5–51.0)
Hemoglobin: 13.9 g/dL (ref 13.0–17.7)
Immature Grans (Abs): 0 x10E3/uL (ref 0.0–0.1)
Immature Granulocytes: 0 %
Lymphocytes Absolute: 3.6 x10E3/uL — ABNORMAL HIGH (ref 0.7–3.1)
Lymphs: 39 %
MCH: 32.1 pg (ref 26.6–33.0)
MCHC: 33.3 g/dL (ref 31.5–35.7)
MCV: 97 fL (ref 79–97)
Monocytes Absolute: 0.8 x10E3/uL (ref 0.1–0.9)
Monocytes: 9 %
Neutrophils Absolute: 4.6 x10E3/uL (ref 1.4–7.0)
Neutrophils: 49 %
Platelets: 224 x10E3/uL (ref 150–450)
RBC: 4.33 x10E6/uL (ref 4.14–5.80)
RDW: 13.6 % (ref 11.6–15.4)
WBC: 9.2 x10E3/uL (ref 3.4–10.8)

## 2024-02-11 LAB — BASIC METABOLIC PANEL WITH GFR
BUN/Creatinine Ratio: 15 (ref 9–20)
BUN: 17 mg/dL (ref 6–20)
CO2: 21 mmol/L (ref 20–29)
Calcium: 10 mg/dL (ref 8.7–10.2)
Chloride: 104 mmol/L (ref 96–106)
Creatinine, Ser: 1.14 mg/dL (ref 0.76–1.27)
Glucose: 85 mg/dL (ref 70–99)
Potassium: 4 mmol/L (ref 3.5–5.2)
Sodium: 141 mmol/L (ref 134–144)
eGFR: 87 mL/min/1.73

## 2024-02-21 ENCOUNTER — Ambulatory Visit
Admission: EM | Admit: 2024-02-21 | Discharge: 2024-02-21 | Disposition: A | Discharge status: Home / Self Care | Attending: Family Medicine | Admitting: Family Medicine

## 2024-02-21 ENCOUNTER — Encounter: Payer: Self-pay | Admitting: Emergency Medicine

## 2024-02-21 DIAGNOSIS — R42 Dizziness and giddiness: Secondary | ICD-10-CM | POA: Insufficient documentation

## 2024-02-21 DIAGNOSIS — R030 Elevated blood-pressure reading, without diagnosis of hypertension: Secondary | ICD-10-CM | POA: Diagnosis present

## 2024-02-21 LAB — TSH: TSH: 1.04 u[IU]/mL (ref 0.350–4.500)

## 2024-02-21 LAB — GLUCOSE, POCT (MANUAL RESULT ENTRY): POCT Glucose (KUC): 115 mg/dL — AB (ref 70–99)

## 2024-02-21 MED ORDER — MECLIZINE HCL 25 MG PO TABS: 25.0000 mg | ORAL_TABLET | Freq: Three times a day (TID) | ORAL | 0 refills | Status: AC | PRN

## 2024-02-21 MED ORDER — AMLODIPINE BESYLATE 5 MG PO TABS: 5.0000 mg | ORAL_TABLET | Freq: Every day | ORAL | 0 refills | Status: AC

## 2024-02-21 NOTE — Discharge Instructions (Addendum)
 The clinic will contact you with results of the blood work done today if positive.  Start Norvasc  blood pressure medication daily.  Also get a blood pressure cuff over-the-counter or on [employer] that is an upper arm cuff and check your blood pressure twice daily for at least 2 weeks.  Keep a log of this and take it to your primary care at your scheduled appointment in March.  Avoid fried foods, high salt foods, processed foods or fast foods.  Avoid alcohol.  Try to cut back on her smoking.  You may try meclizine  for your dizziness.  Please note this will make you drowsy.  Do not drive or drink alcohol on this medication.  Please go to the emergency room if you develop any worsening symptoms prior to seeing your PCP.  I hope you feel better soon!

## 2024-02-21 NOTE — ED Triage Notes (Signed)
 Patient states that he has had dizziness for a week.  Patient was seen on 02/09/24 for the dizziness at [facility] in [place]. Patient states that the dizziness is worse in the morning.

## 2024-02-21 NOTE — ED Provider Notes (Addendum)
 " [facility]    CSN: [id number] Arrival date & time: 02/21/24  9096      History   Chief Complaint Chief Complaint  Patient presents with   Dizziness    HPI Roberto Burns is a 34 y.o. male with a past medical history of asthma presents for dizziness and elevated BP.  Patient reports a month of intermittent episodes of dizziness that he describes as the room spinning that he states is associated with nausea, blurry vision and near syncope.  States symptoms are worse with position change/head turning and sometimes improves with rest/dullness.  He denies any actual syncope, fevers, URI symptoms, vomiting, neck pain, chest pain or palpitations.  Does report he has shortness of breath but he thinks this is related to his anxiety.  He states he stays hydrated and only has 1 to 2 cups of coffee a day.  No energy drinks or dietary supplements/weight loss medications.  He denies any history of hypertension.  He does state he drinks alcohol and does smoke daily.  He was seen in urgent care on 1/12 for similar symptoms.  BMP and CBC were unremarkable.  He has a new patient appointment with a primary care on March 3.  He has not tried any OTC treatments for symptoms.  No other concerns.    Dizziness Associated symptoms: nausea     Past Medical History:  Diagnosis Date   Allergy    Asthma     Patient Active Problem List   Diagnosis Date Noted   Trichomonas exposure 01/19/2023   Screen for STD (sexually transmitted disease) 01/19/2023    Past Surgical History:  Procedure Laterality Date   WRIST FRACTURE SURGERY Left        Home Medications    Prior to Admission medications  Medication Sig Start Date End Date Taking? Authorizing Provider  amLODipine  (NORVASC ) 5 MG tablet Take 1 tablet (5 mg total) by mouth daily. 02/21/24  Yes Keyaan Lederman, Jodi R, NP  meclizine  (ANTIVERT ) 25 MG tablet Take 1 tablet (25 mg total) by mouth 3 (three) times daily as needed for  dizziness. 02/21/24  Yes Veena Sturgess, Jodi R, NP  albuterol  (VENTOLIN  HFA) 108 (90 Base) MCG/ACT inhaler Inhale 2 puffs into the lungs every 4 (four) hours as needed. 02/20/15   [provider]  benzonatate  (TESSALON ) 100 MG capsule Take 1 capsule (100 mg total) by mouth 3 (three) times daily as needed for cough. Patient not taking: Reported on 02/09/2024 01/15/24   Gladis Elsie BROCKS, PA-C  doxycycline  (VIBRAMYCIN ) 100 MG capsule Take 1 capsule (100 mg total) by mouth 2 (two) times daily. Patient not taking: Reported on 02/09/2024 12/19/19   Cook, Jayce G, DO  hydrOXYzine  (ATARAX ) 25 MG tablet Take 1 tablet (25 mg total) by mouth 3 (three) times daily as needed for anxiety. Patient not taking: Reported on 02/09/2024 03/04/22   Dorothyann Drivers, MD  naproxen  (NAPROSYN ) 500 MG tablet Take 1 tablet (500 mg total) by mouth 2 (two) times daily with a meal. Patient not taking: Reported on 02/09/2024 12/21/19   Claudene Tanda POUR, PA-C  fluticasone  (FLONASE ) 50 MCG/ACT nasal spray Place 2 sprays into both nostrils daily. 02/02/17 12/19/19  Lacinda Elsie SQUIBB, PA-C    Family History Family History  Problem Relation Age of Onset   Diabetes Mother    Hypertension Mother     Social History Social History[1]   Allergies   Patient has no known allergies.   Review of Systems Review  of Systems  Eyes:        Blurry vision  Gastrointestinal:  Positive for nausea.  Neurological:  Positive for dizziness.     Physical Exam Triage Vital Signs ED Triage Vitals  Encounter Vitals Group     BP 02/21/24 0914 (!) 152/111     Girls Systolic BP Percentile --      Girls Diastolic BP Percentile --      Boys Systolic BP Percentile --      Boys Diastolic BP Percentile --      Pulse Rate 02/21/24 0914 87     Resp 02/21/24 0914 16     Temp 02/21/24 0914 98.2 F (36.8 C)     Temp Source 02/21/24 0914 Oral     SpO2 02/21/24 0914 100 %     Weight 02/21/24 0912 214 lb 15.2 oz (97.5 kg)     Height 02/21/24 0912  5' 9 (1.753 m)     Head Circumference --      Peak Flow --      Pain Score 02/21/24 0911 7     Pain Loc --      Pain Education --      Exclude from Growth Chart --    Orthostatic VS for the past 24 hrs:  BP- Lying Pulse- Lying BP- Sitting Pulse- Sitting BP- Standing at 0 minutes Pulse- Standing at 0 minutes  02/21/24 0926 (!) 167/98 75 (!) 165/113 76 (!) 172/122 85    Updated Vital Signs BP (!) 152/111 (BP Location: Left Arm)   Pulse 87   Temp 98.2 F (36.8 C) (Oral)   Resp 16   Ht 5' 9 (1.753 m)   Wt 214 lb 15.2 oz (97.5 kg)   SpO2 100%   BMI 31.74 kg/m   Visual Acuity Right Eye Distance:   Left Eye Distance:   Bilateral Distance:    Right Eye Near:   Left Eye Near:    Bilateral Near:     Physical Exam Vitals and nursing note reviewed.  Constitutional:      General: He is not in acute distress.    Appearance: Normal appearance. He is not ill-appearing, toxic-appearing or diaphoretic.  HENT:     Head: Normocephalic and atraumatic.     Right Ear: Tympanic membrane and ear canal normal.     Left Ear: Tympanic membrane and ear canal normal.     Nose: Nose normal.     Mouth/Throat:     Mouth: Mucous membranes are moist.  Eyes:     Extraocular Movements: Extraocular movements intact.     Conjunctiva/sclera: Conjunctivae normal.     Pupils: Pupils are equal, round, and reactive to light.  Cardiovascular:     Rate and Rhythm: Normal rate and regular rhythm.     Heart sounds: Normal heart sounds. No murmur heard. Pulmonary:     Effort: Pulmonary effort is normal.     Breath sounds: Normal breath sounds. No wheezing, rhonchi or rales.  Skin:    General: Skin is warm and dry.  Neurological:     General: No focal deficit present.     Mental Status: He is alert and oriented to person, place, and time.     GCS: GCS eye subscore is 4. GCS verbal subscore is 5. GCS motor subscore is 6.     Cranial Nerves: No facial asymmetry.     Motor: No weakness.      Coordination: Romberg sign negative. Finger-Nose-Finger Test normal.  Gait: Tandem walk normal.     Comments: Strength 5 out of 5 bilateral upper extremity  Psychiatric:        Mood and Affect: Mood normal.        Behavior: Behavior normal.     09:26  Orthostatic Vital Signs Orthostatic Lying BP- Lying: 167/98 (!)Pulse- Lying: 75 Orthostatic Sitting BP- Sitting: 165/113 (!)Pulse- Sitting: 76 Orthostatic Standing at 0 minutes BP- Standing at 0 minutes: 172/122 (!)Pulse- Standing at 0 minutes: 85 Orthostatic Standing at 3 minutes BP- Standing at 3 minutes: 166/111 (!)Pulse- Standing at 3 minutes: 87   UC Treatments / Results  Labs (all labs ordered are listed, but only abnormal results are displayed) Labs Reviewed  GLUCOSE, POCT (MANUAL RESULT ENTRY) - Abnormal; Notable for the following components:      Result Value   POCT Glucose ([facility]) 115 (*)    All other components within normal limits  TSH    EKG   Radiology No results found.  Procedures ED EKG  Date/Time: 02/21/2024 9:33 AM  Performed by: Loreda Myla SAUNDERS, NP Authorized by: Loreda Myla SAUNDERS, NP   ECG interpreted by ED Physician in the absence of a cardiologist: no   Rate:    ECG rate:  76   ECG rate assessment: normal   Rhythm:    Rhythm: sinus rhythm   Ectopy:    Ectopy: none   QRS:    QRS axis:  Normal ST segments:    ST segments:  Normal T waves:    T waves: non-specific   Q waves:    Abnormal Q-waves: not present    (including critical care time)  Medications Ordered in UC Medications - No data to display  Initial Impression / Assessment and Plan / UC Course  I have reviewed the triage vital signs and the nursing notes.  Pertinent labs & imaging results that were available during my care of the patient were reviewed by me and considered in my medical decision making (see chart for details).     I reviewed exam and symptoms with patient.  Discussed multiple potential causes of dizziness  including cardiac, neurological, inner ear, dehydration, anemia, BP, excetra.  His neurological exam is reassuring.  EKG shows sinus rhythm with no acute ST-T wave changes.  Patient with recent BMP and CBC that was unremarkable.  Will check TSH.  Blood sugar 115.  discussed ER evaluation given his persistent symptoms but he declines at this time.  Will do trial of Norvasc  for his blood pressure and advised to keep a BP log x 2 weeks and take to his PCP at his scheduled appointment on March 3.  Will also do trial of meclizine , side effect profile reviewed.  Discussed DASH diet, avoidance of alcohol and smoking sensation.  Strict ER precautions were reviewed and patient verbalized understanding. Final Clinical Impressions(s) / UC Diagnoses   Final diagnoses:  Elevated BP reading w/ no diagnosis of HTN  Dizziness     Discharge Instructions      The clinic will contact you with results of the blood work done today if positive.  Start Norvasc  blood pressure medication daily.  Also get a blood pressure cuff over-the-counter or on [employer] that is an upper arm cuff and check your blood pressure twice daily for at least 2 weeks.  Keep a log of this and take it to your primary care at your scheduled appointment in March.  Avoid fried foods, high salt foods, processed foods or fast foods.  Avoid alcohol.  Try to cut back on her smoking.  You may try meclizine  for your dizziness.  Please note this will make you drowsy.  Do not drive or drink alcohol on this medication.  Please go to the emergency room if you develop any worsening symptoms prior to seeing your PCP.  I hope you feel better soon!     ED Prescriptions     Medication Sig Dispense Auth. Provider   amLODipine  (NORVASC ) 5 MG tablet Take 1 tablet (5 mg total) by mouth daily. 30 tablet Ahriyah Vannest, Jodi R, NP   meclizine  (ANTIVERT ) 25 MG tablet Take 1 tablet (25 mg total) by mouth 3 (three) times daily as needed for dizziness. 30 tablet Ines Warf, Jodi R, NP       PDMP not reviewed this encounter.    Loreda Myla SAUNDERS, NP 02/21/24 1000     [1]  Social History Tobacco Use   Smoking status: Every Day    Current packs/day: 0.50    Types: Cigarettes   Smokeless tobacco: Never  Vaping Use   Vaping status: Former  Substance Use Topics   Alcohol use: Yes    Comment: 4-5 times week   Drug use: No     Loreda Myla SAUNDERS, NP 02/21/24 1001  "

## 2024-03-30 ENCOUNTER — Ambulatory Visit: Payer: Self-pay | Admitting: Family
